# Patient Record
Sex: Female | Born: 1960 | Race: Black or African American | Hispanic: No
Health system: Midwestern US, Community
[De-identification: ages and names within clinical notes are randomized; demographics above are authoritative.]

## PROBLEM LIST (undated history)

## (undated) DIAGNOSIS — M1990 Unspecified osteoarthritis, unspecified site: Secondary | ICD-10-CM

## (undated) DIAGNOSIS — I10 Essential (primary) hypertension: Secondary | ICD-10-CM

## (undated) DIAGNOSIS — G5600 Carpal tunnel syndrome, unspecified upper limb: Secondary | ICD-10-CM

## (undated) DIAGNOSIS — H269 Unspecified cataract: Secondary | ICD-10-CM

## (undated) DIAGNOSIS — G4730 Sleep apnea, unspecified: Secondary | ICD-10-CM

## (undated) DIAGNOSIS — E349 Endocrine disorder, unspecified: Secondary | ICD-10-CM

## (undated) DIAGNOSIS — T7840XA Allergy, unspecified, initial encounter: Secondary | ICD-10-CM

## (undated) DIAGNOSIS — E079 Disorder of thyroid, unspecified: Secondary | ICD-10-CM

## (undated) HISTORY — PX: CARPAL TUNNEL RELEASE: SHX101

## (undated) HISTORY — DX: Disorder of thyroid, unspecified: E07.9

## (undated) HISTORY — DX: Unspecified osteoarthritis, unspecified site: M19.90

## (undated) HISTORY — PX: APPENDECTOMY: SHX54

## (undated) HISTORY — DX: Carpal tunnel syndrome, unspecified upper limb: G56.00

## (undated) HISTORY — PX: ABDOMINAL HYSTERECTOMY: SHX81

## (undated) HISTORY — DX: Unspecified cataract: H26.9

## (undated) HISTORY — PX: HEEL SPUR EXCISION: SHX1733

## (undated) HISTORY — DX: Endocrine disorder, unspecified: E34.9

## (undated) HISTORY — DX: Essential (primary) hypertension: I10

## (undated) HISTORY — DX: Sleep apnea, unspecified: G47.30

## (undated) HISTORY — DX: Allergy, unspecified, initial encounter: T78.40XA

---

## 2015-06-08 NOTE — Discharge Summary (Signed)
 Inpatient Patient Summary               [facility] - [facility]  [address]  [address]  [place], [place] [address]  [phone]  Patient Discharge Instructions  Name: Rebecca Frank, Rebecca Frank  Current Date: 06/08/2015 12:24:38  DOB: 1960-07-18 MRN: [id number] FIN: [phone]  Patient Address: [address] [place] [facility] [address]  Patient Phone: [phone]  Primary Care Provider:  Name: FRIZELLE-MD,  KATHERINE SEA  Phone: [phone]   Immunizations Provided:    Discharge Diagnosis: 1:Heel spur; 2:Plantar fasciitis  Discharged To: ANTICIPATED%>  Home Treatments: ANTICIPATED%>  Devices/Equipment: REHAB%>  Post Hospital Services: HOSPITAL SERVICES%>  Professional Skilled Services: SKILLED SERVICES%>  Therapist, sports and Community Resources: SERV AND COMM RES, ANTICIPATED%>  Mode of Discharge Transportation: TRANSPORTATION%>  Discharge Orders         Diet Instruction Regular home diet  Discharge Patient 06/08/15 12:19:00 EST, Discharge Home/Self Care, when patient meets PACU criteria  Discharge Special Instructions If you experience swelling or a significantly increased amount of pain or bleeding, please call the office at [phone].  Discharge Special Instructions Follow up in 1 week. You will be called to set up an appointment.  Discharge Special Instructions Keep your dressing dry and intact. You may shower using 13 gallon kitchen bag to protect and keep bandage dry. If it gets wet, remove ACE wrap and dry in dryer. Use hair blow dryer to dry the other bandages.      Comment:   Medications   During the course of your visit, your medication list was updated with the most current information. The details of those changes are reflected below:         Medications that have not changed  Other Medications  atorvastatin (atorvastatin 40 mg oral tablet) 1 Tabs Oral (given by mouth) every day.  Last Dose:____________________  hydrochlorothiazide  (hydrochlorothiazide 25 mg oral tablet) 1 Tabs Oral (given by mouth) every day.  Last Dose:____________________  levothyroxine 0.088 Milligram Oral (given by mouth) every day.  Last Dose:____________________      [facility] would like to thank you for allowing us  to assist you with your healthcare needs. The following includes patient education materials and information regarding your injury/illness.  Sarnowski, Kasen Z has been given the following list of follow-up instructions, prescriptions, and patient education materials:  Follow-up Instructions           It is important to always keep an active list of medications available so that you can share with other providers and manage your medications appropriately. As an additional courtesy, we are also providing you with your final active medications list that you can keep with you.          atorvastatin (atorvastatin 40 mg oral tablet) 1 Tabs Oral (given by mouth) every day.  hydrochlorothiazide (hydrochlorothiazide 25 mg oral tablet) 1 Tabs Oral (given by mouth) every day.  levothyroxine 0.088 Milligram Oral (given by mouth) every day.      Take only the medications listed above. Contact your doctor prior to taking any medications not on this list.  Discharge instructions, if any, will display below  Instructions for Diet: INSTRUCTIONS FOR DIET%>   Instructions for Supplements: SUPPLEMENT INSTRUCTIONS%>   Instructions for Activity: INSTRUCTIONS FOR ACTIVITY%>   Instructions for Wound Care: INSTRUCTIONS FOR WOUND CARE%>  Medication leaflets, if any, will display below     Patient education materials, if  any, will display below             Out Patient Post Operative Instructions  General Information:  - You may experience lightheadedness, forgetfulness, dizziness, sleepiness, headache, nausea, sore throat, or muscular pains following surgery  - For any emergencies call 911  -Your reflexes will be dimished after receiving anesthetic drugs         Do not operate  a vechicle or heavy machinery for 24 hours         Do not drink any alcoholic beverages or smoke for 24 hours         Avoid making any important decisions for 24 hours         Do not stay alone for the next 24 hours  Diet/Fluids  -Begin with clear liquids, then progress to your regular diet if no nausea   -Greasy and spicy foods are not advised  Activity  -You are advised to go directly home from the hospital and restrict your activites for the rest of the day  Medications:  -Follow your Discharge Medication sheet, as instructed  -If ordered, begin any newly prescribed medications. Discontinue use if: nausea, vomiting, itching or rash develops and call your doctor   -If you develop a fever (over 101*), chills, active bleeding, excessive swelling, or nausea or vomiting past the 24hr period call your doctor.   Dressing:   Keep clean and dry:   Change dressing:   Additional Instructions: SEE DR GUDAS INSTRUCTION SHEET  Follow up Care:  Call the office if you don't already have an appt scheduled.                            IS IT A STROKE? Act FAST and Check for these signs:   FACE Does the face look uneven?   ARM Does one arm drift down?   SPEECH Does their speech sound strange?   TIME Call 9-1-1 at any sign of stroke  Heart Attack Signs  Chest discomfort: Most heart attacks involve discomfort in the center of the chest and lasts more than a few minutes, or goes away and comes back. It can feel like uncomfortable pressure, squeezing, fullness or pain.  Discomfort in upper body: Symptoms can include pain or discomfort in one or both arms, back, neck, jaw or stomach.  Shortness of breath: With or without discomfort.  Other signs: Breaking out in a cold sweat, nausea, or lightheaded.  Remember, MINUTES DO MATTER. If you experience any of these heart attack warning signs, call 9-1-1 to get immediate medical attention!       Yes - Patient/Family/Caregiver demonstrates understanding of instructions  given  ______________________________ ___________ ___________________ ___________  Patient/Family/ Caregiver Signature Date/Time Provider Signature Date/Time

## 2015-06-08 NOTE — Discharge Summary (Signed)
Inpatient Clinical Summary             [facility]  Post-Acute Care Transfer Instructions  PERSON INFORMATION   Name: Rebecca Frank, Rebecca Frank   MRN: [id number]    FIN#: [id number]   PHYSICIANS  Admitting Physician: Fortunato Curling  Attending Physician: Fortunato Curling   PCP: Jonne Ply SEA  Discharge Diagnosis: 1:Heel spur; 2:Plantar fasciitis  Comment:       PATIENT EDUCATION INFORMATION  Instructions:             OUT PATIENT POST OP [place] (CUSTOM)  Medication Leaflets:               Follow-up:                               MEDICATION LIST  Medication Reconciliation at Discharge:         Medications that have not changed  Other Medications  atorvastatin (atorvastatin 40 mg oral tablet) 1 Tabs Oral (given by mouth) every day.  Last Dose:____________________  hydrochlorothiazide (hydrochlorothiazide 25 mg oral tablet) 1 Tabs Oral (given by mouth) every day.  Last Dose:____________________  levothyroxine 0.088 Milligram Oral (given by mouth) every day.  Last Dose:____________________         Patient's Final Home Medication List Upon Discharge:          atorvastatin (atorvastatin 40 mg oral tablet) 1 Tabs Oral (given by mouth) every day.  hydrochlorothiazide (hydrochlorothiazide 25 mg oral tablet) 1 Tabs Oral (given by mouth) every day.  levothyroxine 0.088 Milligram Oral (given by mouth) every day.         Comment:       ORDERS         Order Name Order Details   Diet Instruction Regular home diet   Discharge Patient 06/08/15 12:19:00 EST, Discharge Home/Self Care, when patient meets PACU criteria   Discharge Special Instructions Keep your dressing dry and intact. You may shower using 13 gallon kitchen bag to protect and keep bandage dry. If it gets wet, remove ACE wrap and dry in dryer. Use hair blow dryer to dry the other bandages.   Discharge Special Instructions If you experience swelling or a significantly increased amount of pain or bleeding, please call the office at  [phone].   Discharge Special Instructions Follow up in 1 week. You will be called to set up an appointment.

## 2015-06-08 NOTE — Procedures (Signed)
IntraOp Record - [facility]             IntraOp Record - [facility] Summary                                                                   Primary Physician:        Fortunato Curling    Case Number:              [id number]    Finalized Date/Time:      06/08/15 12:07:04    Pt. Name:                 Rebecca Frank, Rebecca Frank    D.O.B./Sex:               24-Sep-1960    Female    Med Rec #:                [id number]    Physician:                Fortunato Curling    Financial #:              [id number]    Pt. Type:                 S    Room/Bed:                 /    Admit/Disch:              06/08/15 09:49:00 -    Institution:       [facility] - Case Times                                                                                                         Entry 1                                                                                                          Patient      In Room Time             06/08/15 11:23:00               Out Room Time                   06/08/15 12:04:00    Anesthesia     Procedure  Start Time               06/08/15 11:40:00               Stop Time                       06/08/15 12:01:00    Last Modified By:         Delton See, RN, PATRICIA A                              06/08/15 12:06:47      [facility] - Case Times Audit                                                                          06/08/15 12:06:47         Owner: [email or web address]                             Modifier: [email or web address]                                                      <+> 1         Out Room Time        <+> 1         Stop Time     06/08/15 11:40:54         Owner: [email or web address]                             Modifier: [email or web address]                                                      <+> 1         Start Time        [facility] - Safety Checklist - Sign In                                                                                         Entry 1  History/Physical on       Yes                             Procedure Consent               Yes    Chart                                                     on Chart     Site Marked (if           Yes    applicable)     Last Modified By:         Delton See, RN, PATRICIA A                              06/08/15 11:08:52      [facility] - Case Attendance                                                                                                    Entry 1                         Entry 2                         Entry 3                                          Case Attendee             KIRSHTEIN-MD,  Cristy Hilts, RN, PATRICIA A    Role Performed            Anesthesiologist                Surgeon Primary                 Circulator    Time In                   06/08/15 11:23:00               06/08/15 11:23:00               06/08/15 11:23:00    Time Out     Procedure                 Foot Plantar Fasciotomy         Foot Plantar Fasciotomy         Foot Plantar Fasciotomy  Open(Foot, Right), Foot         Open(Foot, Right), Foot         Open(Foot, Right), Foot                              Bone Ostectomy                  Bone Ostectomy                  Bone Ostectomy                              SCIP(Heel, Right)               SCIP(Heel, Right)               SCIP(Heel, Right)    Last Modified By:         Delton See RN, PATRICIA Trilby Leaver, RN, PATRICIA Trilby Leaver, RN, PATRICIA A                              06/08/15 11:40:47               06/08/15 11:40:47               06/08/15 11:40:47                                Entry 4                         Entry 5                                                                          Case Attendee             Almon Register, RN, Lillia Pauls    Role Performed            Surgical Scrub                  Circulator Relief    Time In                   06/08/15 11:23:00               06/08/15  11:37:00    Time Out     Procedure                 Foot Plantar Fasciotomy                              Open(Foot, Right), Foot  Bone Ostectomy                              SCIP(Heel, Right)    Last Modified By:         Delton See RN, PATRICIA Trilby Leaver, RN, PATRICIA A                              06/08/15 11:40:47               06/08/15 11:40:47      [facility] - Case Attendance Audit                                                                     06/08/15 11:40:47         Owner: [email or web address]                             Modifier: [email or web address]                                                          1     <+> Time In            1     <*> Procedure                              Foot Plantar Fasciotomy Open(Foot, Right), Foot Bone Ostectomy                                                             SCIP(Heel, Right)            2     <+> Time In            2     <*> Procedure                              Foot Plantar Fasciotomy Open(Foot, Right), Foot Bone Ostectomy                                                             SCIP(Heel, Right)            3     <+> Time In            3     <*> Procedure  Foot Plantar Fasciotomy Open(Foot, Right), Foot Bone Ostectomy                                                             SCIP(Heel, Right)            4     <+> Time In            4     <*> Procedure                              Foot Plantar Fasciotomy Open(Foot, Right), Foot Bone Ostectomy                                                             SCIP(Heel, Right)        <+> 5         Case Attendee        <+> 5         Role Performed        <+> 5         Time In     06/08/15 11:08:48         Owner: [email or web address]                             Modifier: [email or web address]                                                      <+> 1         Procedure        <+> 2         Procedure        <+> 3         Case Attendee        <+> 3         Role Performed        <+> 3         Procedure         <+> 4         Case Attendee        <+> 4         Role Performed        <+> 4         Procedure        [facility] - Skin Assessment  Entry 1                                                                                                          Skin Integrity            Intact    Last Modified By:         Delton See, RN, PATRICIA A                              06/08/15 11:08:58      [facility] - Patient Positioning                                                                                                Entry 1                                                                                                          Procedure                 Foot Plantar Fasciotomy         Body Position                   Supine                              Open(Foot, Right), Foot                              Bone Ostectomy                              SCIP(Heel, Right)    Left Arm Position         Extended on Padded Arm          Right Arm Position              Extended on Padded Arm  Board w/Security Strap                                          Board w/Security Strap    Left Leg Position         Extended Security Strap         Right Leg Position              Extended Security Strap    Feet Uncrossed            Yes                             Pressure Points                 Yes                                                              Checked     Positioning Device        Safety Strap, Pillow,           Positioned By                   [employer], RN, PATRICIA A,                              Arm Cradle Foam/Gel, C                                          GUDAS-DPM,  CHARLES J,                              Cradle Foam                                                     KIRSHTEIN-MD,  JONATHAN    Outcome Met (O.80)        Yes    Last Modified By:         Delton See, RN, PATRICIA A                              06/08/15  11:46:43      [facility] - Skin Prep  Entry 1                                                                                                          Hair Removal     Skin Prep      Prep Agents (Im.270)     Chlorhexidine Gluconate         Prep Area (Im.270)              Foot                              2% w/Alcohol     Prep Area Details        Right                           Prep By                         Jarvis Newcomer A    Outcome Met (O.100)       Yes    Last Modified By:         Delton See, RN, PATRICIA A                              06/08/15 11:09:22      [facility] - Counts Initial and Final                                                                                           Entry 1                                                                                                          Initial Counts      Initial Counts           NELSON, RN, PATRICIA A,         Items included in               Sponges, Sharps     Performed By             Ida Rogue  the Initial Count     Final Counts      Final Counts             NELSON, RN, PATRICIA A,         Final Count Status              Correct     Performed By             Jarvis Newcomer A     Items Included in        Sponges, Sharps     Final Count     Outcome Met (O.20)        Yes    Last Modified By:         Delton See, RN, PATRICIA A                              06/08/15 11:41:22      [facility] - General Case Data                                                                                                  Entry 1                                                                                                          Case Information      ASA Class                2                               Case Level                      Level 3     OR                       JI 03                           Specialty                       Orthopedic (SN)     Wound Class               1-Clean    Preop Diagnosis           HEEL SPUR, PLANTAR  FASCIITIS    Last Modified By:         Delton See, RN, PATRICIA A                              06/08/15 11:40:18      [facility] - Fire Risk Assessment                                                                                               Entry 1                                                                                                          Fire Risk                 Alcohol Based Prep              Fire Risk Score                 2    Assessment: If            Solution, Ignition    checked, checkmark        Source In Use    = 1 point     Last Modified By:         Delton See, RN, PATRICIA A                              06/08/15 11:42:22      [facility] - Safety Checklist - Time Out                                                                                        Entry 1  Surgical/Procedure        Yes                             Time Out Complete               06/08/15 11:32:00    Team confirms     correct patient,     correct site and     correct procedure     Last Modified By:         Delton See, RN, PATRICIA A                              06/08/15 11:37:08    General Comments:            2nd time out at  1140      [facility] - Cautery                                                                                                            Entry 1                                                                                                          ESU Type                  GENERATOR                       Identification                  [id number]                              COVIDIEN/[employer]LEY[employer]              Number     Coag Setting (watts)      25                              Cut Setting (watts)             25    Grounding Pad             Yes                             Grounding Pad  NELSON, RN, PATRICIA A    Needed?                                                    Applied By     [employer] Lot # [id number]             Outcome Met (O.10)              Yes    Last Modified By:         Delton See, RN, PATRICIA A                              06/08/15 11:13:59      [facility] - Patient Care Devices                                                                                               Entry 1                         Entry 2                                                                          Equipment Type            MACHINE SEQUENTIAL              Gustavus Bryant                              COMPRESSION    SCD Sleeve Site           Leg Left    Equipment/Tag Number      [id number]                          [id number]    Initiated Pre             Yes    Induction     Last Modified By:         Delton See RN, PATRICIA Trilby Leaver, RN, PATRICIA A                              06/08/15 11:43:12               06/08/15 11:43:12      [facility] - Tourniquet  Entry 1                                                                                                          Tourniquet Type           TOURNIQUET CUFF 18IN W/         Serial Number                   [id number]                              PLC CONN STRL DISP    Setting                   250 mmHg                        Placement                       Ankle Right    Padding (Im.120)          Yes    Tourniquet Times      Inflated                 06/08/15 11:39:00               Deflated                        06/08/15 11:59:00    Applied By                Fortunato Curling           Outcome Met (O.60)              Yes    Last Modified By:         Delton See, RN, PATRICIA A                              06/08/15 12:04:01      [facility] - Tourniquet Audit                                                                          06/08/15 12:04:01         Owner: [email or web address]                              Modifier: [email or web address]  1     <*> Tourniquet Type                        TOURNIQUET CUFF 18IN W/ PLC CONN STRL DISP            1     <+> Deflated     06/08/15 11:41:02         Owner: [email or web address]                             Modifier: [email or web address]                                                          1     <*> Tourniquet Type                        TOURNIQUET CUFF 18IN W/ PLC CONN STRL DISP            1     <+> Inflated        [facility] - Medications                                                                                                        Entry 1                         Entry 2                                                                          Time Administered         06/08/15 11:33:00               06/08/15 11:33:00    Medication                BUPIVACAINE HCL 0.5%            LIDOCAINE HCL 2%                              INJECTION 0.5%             INJECTION 2%    Route of Admin            Local Injection                 Local Injection    Dose/Volume               5ml  5ml    (include amount and     unit of measure)     Site                      Foot                            Foot    Site Detail               Right                           Right    Administered By           Tobie Lords,  CHARLES J    Outcome Met (O.130)       Yes                             Yes    Last Modified By:         Delton See RN, PATRICIA Trilby Leaver, RN, PATRICIA A                              06/08/15 11:45:12               06/08/15 11:45:12      [facility] - Dressing/Packing                                                                                                   Entry 1                                                                                                          Site                      Foot                            Site Details                    Right    Dressing Item     Details       Dressing Item            Medicated Gauze, 4x4's,     (Im.290)  Elastic wrap,                              Kerlix/Kling Wrap    Last Modified By:         Delton See, RN, PATRICIA A                              06/08/15 11:42:17      [facility] - Procedures                                                                                                         Entry 1                         Entry 2                                                                          Procedure     Description      Procedure                Foot Plantar Fasciotomy         Foot Bone Ostectomy SCIP                              Open     Modifiers                Foot, Right                     Heel, Right     Surgical Procedure       FOOT PLANTAR FASCIOTOMY         HEEL SPUR REMOVAL RIGHT     Text                     RIGHT    Primary Procedure         Yes                             No    Primary Surgeon           Francoise Ceo    Start                     06/08/15 11:40:00               06/08/15 11:40:00    Stop  06/08/15 12:01:00               06/08/15 12:01:00    Anesthesia Type           General                         General    Surgical Service          Orthopedic (SN)                 Orthopedic (SN)    Wound Class               1-Clean                         1-Clean    Last Modified By:         Delton See RN, PATRICIA Trilby Leaver, RN, PATRICIA A                              06/08/15 12:07:00               06/08/15 12:07:00      [facility] - Procedures Audit                                                                          06/08/15 12:07:00         Owner: [email or web address]                             Modifier: [email or web address]                                                      <+> 1         Start        <+> 1         Stop        <+> 2         Start        <+> 2         Stop        [facility] - Safety Checklist - Sign Out                                                                                         Entry 1  Patient Safety            Yes    Communication Guide     Used Throughout Case     Last Modified By:         Delton See, RN, PATRICIA A                              06/08/15 11:08:55      [facility] - Transfer                                                                                                           Entry 1                                                                                                          Transferred By            Delton See, RN, PATRICIA A,         Via                             Stretcher                              KIRSHTEIN-MD,  JONATHAN    Post-op Destination       PACU    Skin Assessment      Condition                Intact    Last Modified ByDelton See, RN, PATRICIA A                              06/08/15 11:11:21

## 2015-10-10 ENCOUNTER — Other Ambulatory Visit: Payer: Self-pay | Admitting: Internal Medicine

## 2015-10-10 DIAGNOSIS — Z1231 Encounter for screening mammogram for malignant neoplasm of breast: Secondary | ICD-10-CM

## 2015-10-28 ENCOUNTER — Ambulatory Visit
Admission: RE | Admit: 2015-10-28 | Discharge: 2015-10-28 | Disposition: A | Discharge status: Home / Self Care | Payer: PRIVATE HEALTH INSURANCE | Source: Ambulatory Visit | Attending: Internal Medicine | Admitting: Internal Medicine

## 2015-10-28 DIAGNOSIS — Z1231 Encounter for screening mammogram for malignant neoplasm of breast: Secondary | ICD-10-CM | POA: Insufficient documentation

## 2016-11-16 ENCOUNTER — Encounter: Payer: Self-pay | Admitting: Certified Nurse Midwife

## 2017-01-03 ENCOUNTER — Other Ambulatory Visit: Payer: Self-pay | Admitting: Internal Medicine

## 2017-01-03 DIAGNOSIS — Z1231 Encounter for screening mammogram for malignant neoplasm of breast: Secondary | ICD-10-CM

## 2017-01-04 ENCOUNTER — Ambulatory Visit
Admission: RE | Admit: 2017-01-04 | Discharge: 2017-01-04 | Disposition: A | Discharge status: Home / Self Care | Payer: PRIVATE HEALTH INSURANCE | Source: Ambulatory Visit | Attending: Internal Medicine | Admitting: Internal Medicine

## 2017-01-04 DIAGNOSIS — Z1231 Encounter for screening mammogram for malignant neoplasm of breast: Secondary | ICD-10-CM | POA: Insufficient documentation

## 2017-07-22 ENCOUNTER — Ambulatory Visit (INDEPENDENT_AMBULATORY_CARE_PROVIDER_SITE_OTHER): Payer: PRIVATE HEALTH INSURANCE | Admitting: Urology

## 2017-07-22 ENCOUNTER — Encounter: Payer: Self-pay | Admitting: Urology

## 2017-07-22 VITALS — BP 152/82 | HR 89 | Ht 68.0 in | Wt 250.9 lb

## 2017-07-22 DIAGNOSIS — R3129 Other microscopic hematuria: Secondary | ICD-10-CM

## 2017-07-22 LAB — URINALYSIS, COMPLETE
Bilirubin, UA: NEGATIVE
GLUCOSE, UA: NEGATIVE
Ketones, UA: NEGATIVE
Leukocytes, UA: NEGATIVE
NITRITE UA: NEGATIVE
PH UA: 7 (ref 5.0–7.5)
Protein, UA: NEGATIVE
Specific Gravity, UA: 1.01 (ref 1.005–1.030)
UUROB: 0.2 mg/dL (ref 0.2–1.0)

## 2017-07-22 LAB — MICROSCOPIC EXAMINATION: EPITHELIAL CELLS (NON RENAL): NONE SEEN /HPF (ref 0–10)

## 2017-07-22 NOTE — Progress Notes (Signed)
07/22/2017 2:19 PM   Laura Walls 01-23-1961 [id number]  Referring provider: Gilles Chiquito, MD [address] [place], [place] [address]  Chief Complaint  Patient presents with  . Hematuria    HPI: Consulted to assess the patient's microscopic hematuria recently found on routine urine and blood work.  She has never smoked.  She does not take daily aspirin or blood thinners.  She voids every 2 or 3 hours and rarely has incontinence.  Sometimes she gets up once a night  She denies history of kidney stones previous GU surgery and urinary tract infections.  Bowel movements normal.  She has no neurologic issues.  Modifying factors: There are no other modifying factors  Associated signs and symptoms: There are no other associated signs and symptoms Aggravating and relieving factors: There are no other aggravating or relieving factors Severity: Moderate Duration: Persistent   PMH: Past Medical History:  Diagnosis Date  . Hormone disorder   . Hypertension   . Thyroid disease     Surgical History: Past Surgical History:  Procedure Laterality Date  . ABDOMINAL HYSTERECTOMY    . HEEL SPUR EXCISION      Home Medications:  Allergies as of 07/22/2017   Not on File     Medication List        Accurate as of 07/22/17  2:19 PM. Always use your most recent med list.          atorvastatin 10 MG tablet Commonly known as:  LIPITOR Take by mouth.   hydrochlorothiazide 25 MG tablet Commonly known as:  HYDRODIURIL Take by mouth.   levothyroxine 125 MCG tablet Commonly known as:  SYNTHROID, LEVOTHROID Take by mouth.   PREPOPIK 10-3.5-12 MG-GM-GM Pack Generic drug:  Sod Picosulfate-Mag Ox-Cit Acd Take by mouth.       Allergies: Not on File  Family History: Family History  Problem Relation Age of Onset  . Breast cancer Neg Hx   . Bladder Cancer Neg Hx   . Kidney cancer Neg Hx     Social History:  reports that she has never smoked. She has never used  smokeless tobacco. She reports that she drank alcohol. She reports that she does not use drugs.  ROS: UROLOGY Frequent Urination?: No Hard to postpone urination?: No Burning/pain with urination?: No Get up at night to urinate?: No Leakage of urine?: No Urine stream starts and stops?: No Trouble starting stream?: No Do you have to strain to urinate?: No Blood in urine?: Yes Urinary tract infection?: No Sexually transmitted disease?: No Injury to kidneys or bladder?: No Painful intercourse?: No Weak stream?: No Currently pregnant?: No Vaginal bleeding?: No Last menstrual period?: n  Gastrointestinal Nausea?: No Vomiting?: No Indigestion/heartburn?: No Diarrhea?: No Constipation?: No  Constitutional Fever: No Night sweats?: No Weight loss?: No Fatigue?: No  Skin Skin rash/lesions?: No Itching?: No  Eyes Blurred vision?: No Double vision?: No  Ears/Nose/Throat Sore throat?: No Sinus problems?: No  Hematologic/Lymphatic Swollen glands?: No Easy bruising?: No  Cardiovascular Leg swelling?: No Chest pain?: No  Respiratory Cough?: No Shortness of breath?: No  Endocrine Excessive thirst?: No  Musculoskeletal Back pain?: No Joint pain?: No  Neurological Headaches?: No Dizziness?: No  Psychologic Depression?: No Anxiety?: No  Physical Exam: BP (!) 152/82 (BP Location: Right Arm, Patient Position: Sitting, Cuff Size: Normal)   Pulse 89   Ht 5\' 8"  (1.727 m)   Wt 113.8 kg (250 lb 14.4 oz)   BMI 38.15 kg/m   Constitutional:  Alert  and oriented, No acute distress. HEENT: [place] AT, moist mucus membranes.  Trachea midline, no masses. Cardiovascular: No clubbing, cyanosis, or edema. Respiratory: Normal respiratory effort, no increased work of breathing. GI: Abdomen is soft, nontender, nondistended, no abdominal masses GU: No CVA tenderness.  Skin: No rashes, bruises or suspicious lesions. Lymph: No cervical or inguinal adenopathy. Neurologic: Grossly  intact, no focal deficits, moving all 4 extremities. Psychiatric: Normal mood and affect.  Laboratory Data: No results found for: WBC, HGB, HCT, MCV, PLT  No results found for: CREATININE  No results found for: PSA  No results found for: TESTOSTERONE  No results found for: HGBA1C  Urinalysis No results found for: COLORURINE, APPEARANCEUR, LABSPEC, PHURINE, GLUCOSEU, HGBUR, BILIRUBINUR, KETONESUR, PROTEINUR, UROBILINOGEN, NITRITE, LEUKOCYTESUR  Pertinent Imaging: none  Assessment & Plan: Workup for microscopic hematuria described.  CT scan ordered.  Patient will return for cystoscopy  1. Microscopic hematuria  - Urinalysis, Complete   No follow-ups on file.  Martina SinnerMACDIARMID,Gar Glance A, MD  [facility] [address], [address] [place], [place] [address] [phone]

## 2017-08-28 ENCOUNTER — Ambulatory Visit
Admission: RE | Admit: 2017-08-28 | Discharge: 2017-08-28 | Disposition: A | Discharge status: Home / Self Care | Payer: PRIVATE HEALTH INSURANCE | Source: Ambulatory Visit | Attending: Urology | Admitting: Urology

## 2017-08-28 DIAGNOSIS — R3129 Other microscopic hematuria: Secondary | ICD-10-CM | POA: Diagnosis present

## 2017-08-28 MED ORDER — IOPAMIDOL (ISOVUE-300) INJECTION 61%
125.0000 mL | Freq: Once | INTRAVENOUS | Status: AC | PRN
  Administered 2017-08-28: 125 mL via INTRAVENOUS

## 2017-09-02 ENCOUNTER — Ambulatory Visit (INDEPENDENT_AMBULATORY_CARE_PROVIDER_SITE_OTHER): Payer: PRIVATE HEALTH INSURANCE | Admitting: Urology

## 2017-09-02 ENCOUNTER — Encounter: Payer: Self-pay | Admitting: Urology

## 2017-09-02 VITALS — BP 153/89 | HR 99 | Ht 68.0 in | Wt 246.7 lb

## 2017-09-02 DIAGNOSIS — R3129 Other microscopic hematuria: Secondary | ICD-10-CM

## 2017-09-02 LAB — MICROSCOPIC EXAMINATION

## 2017-09-02 LAB — URINALYSIS, COMPLETE
Bilirubin, UA: NEGATIVE
GLUCOSE, UA: NEGATIVE
Ketones, UA: NEGATIVE
Nitrite, UA: NEGATIVE
Protein, UA: NEGATIVE
SPEC GRAV UA: 1.02 (ref 1.005–1.030)
Urobilinogen, Ur: 1 mg/dL (ref 0.2–1.0)
pH, UA: 7 (ref 5.0–7.5)

## 2017-09-02 MED ORDER — CIPROFLOXACIN HCL 500 MG PO TABS
500.0000 mg | ORAL_TABLET | Freq: Once | ORAL | Status: AC
  Administered 2017-09-02: 500 mg via ORAL

## 2017-09-02 MED ORDER — LIDOCAINE HCL URETHRAL/MUCOSAL 2 % EX GEL
1.0000 | Freq: Once | CUTANEOUS | Status: AC
Start: 2017-09-02 — End: 2017-09-02
  Administered 2017-09-02: 1 via URETHRAL

## 2017-09-02 NOTE — Progress Notes (Signed)
   09/02/2017 3:07 PM   Laura Walls Sep 22, 1960 [id number]  Referring provider: Gilles Chiquito, MD [address] [place], [place] [address]  Chief Complaint  Patient presents with  . Cysto    HPI: Consulted to assess the patient's microscopic hematuria recently found on routine urine and blood work.  She has never smoked.  She does not take daily aspirin or blood thinners.  She voids every 2 or 3 hours and rarely has incontinence.  Sometimes she gets up once a night  Today Frequency stable.  CT scan normal Cystoscopy: After verbal and written consent the patient underwent flexible cystoscopy utilizing sterile technique.  Bladder mucosa and trigone were normal.  There is no carcinoma.  Ureteral efflux was normal.  There is no cystitis.  There was no foreign body.  Procedure was very well tolerated   PMH: Past Medical History:  Diagnosis Date  . Hormone disorder   . Hypertension   . Thyroid disease     Surgical History: Past Surgical History:  Procedure Laterality Date  . ABDOMINAL HYSTERECTOMY    . HEEL SPUR EXCISION      Home Medications:  Allergies as of 09/02/2017   No Known Allergies     Medication List        Accurate as of 09/02/17  3:07 PM. Always use your most recent med list.          atorvastatin 10 MG tablet Commonly known as:  LIPITOR Take by mouth.   hydrochlorothiazide 25 MG tablet Commonly known as:  HYDRODIURIL Take by mouth.   levothyroxine 125 MCG tablet Commonly known as:  SYNTHROID, LEVOTHROID Take by mouth.   PREPOPIK 10-3.5-12 MG-GM-GM Pack Generic drug:  Sod Picosulfate-Mag Ox-Cit Acd Take by mouth.       Allergies: No Known Allergies  Family History: Family History  Problem Relation Age of Onset  . Breast cancer Neg Hx   . Bladder Cancer Neg Hx   . Kidney cancer Neg Hx     Social History:  reports that she has never smoked. She has never used smokeless tobacco. She reports that she drank alcohol. She reports  that she does not use drugs.  ROS:                                        Physical Exam: BP (!) 153/89 (BP Location: Right Arm, Patient Position: Sitting, Cuff Size: Large)   Pulse 99   Ht  (1.727 m)   Wt 246 lb 11.2 oz (111.9 kg)   BMI 37.51 kg/m   Constitutional:  Alert and oriented, No acute distress.   Laboratory Data:  No results found for: TESTOSTERONE  No results found for: HGBA1C  Urinalysis    Component Value Date/Time   APPEARANCEUR Clear 07/22/2017 1407   GLUCOSEU Negative 07/22/2017 1407   BILIRUBINUR Negative 07/22/2017 1407   PROTEINUR Negative 07/22/2017 1407   NITRITE Negative 07/22/2017 1407   LEUKOCYTESUR Negative 07/22/2017 1407    Pertinent Imaging:   Assessment & Plan: The patient has benign microscopic hematuria and will be seen on a as needed basis  1. Microscopic hematuria  - Urinalysis, Complete - ciprofloxacin (CIPRO) tablet 500 mg - lidocaine (XYLOCAINE) 2 % jelly 1 application   No follow-ups on file.  Martina Sinner, MD  [facility] [address], [address] [place], [place] [address] [phone]

## 2018-11-12 ENCOUNTER — Other Ambulatory Visit: Payer: Self-pay

## 2018-11-12 MED ORDER — ATORVASTATIN CALCIUM 10 MG PO TABS: 10.0000 mg | ORAL_TABLET | Freq: Every day | ORAL | 3 refills | Status: DC

## 2018-11-17 ENCOUNTER — Other Ambulatory Visit: Payer: Self-pay

## 2018-11-17 DIAGNOSIS — I10 Essential (primary) hypertension: Secondary | ICD-10-CM

## 2018-11-17 DIAGNOSIS — E039 Hypothyroidism, unspecified: Secondary | ICD-10-CM

## 2018-11-17 MED ORDER — HYDROCHLOROTHIAZIDE 25 MG PO TABS: 25.0000 mg | ORAL_TABLET | Freq: Every day | ORAL | 0 refills | Status: DC

## 2018-11-17 MED ORDER — LEVOTHYROXINE SODIUM 125 MCG PO TABS: 125.0000 ug | ORAL_TABLET | Freq: Every day | ORAL | 0 refills | Status: DC

## 2018-11-17 NOTE — Telephone Encounter (Signed)
Patient's last visit 06/2017. Last TSH check 08/2017 and was ok. (Was high on 05/2017). She needs a f/u TSH and a f/u visit ([facility] med request was for HTN).

## 2018-11-17 NOTE — Telephone Encounter (Signed)
Called made pt appt for labs and physical

## 2019-01-06 ENCOUNTER — Encounter: Payer: Self-pay | Admitting: Internal Medicine

## 2019-01-07 ENCOUNTER — Encounter: Payer: Self-pay | Admitting: Internal Medicine

## 2019-01-13 ENCOUNTER — Ambulatory Visit: Payer: 59

## 2019-01-13 ENCOUNTER — Other Ambulatory Visit: Payer: Self-pay

## 2019-01-13 DIAGNOSIS — Z01818 Encounter for other preprocedural examination: Secondary | ICD-10-CM

## 2019-01-13 LAB — POCT URINALYSIS DIPSTICK
Bilirubin, UA: NEGATIVE
Glucose, UA: NEGATIVE
Ketones, UA: NEGATIVE
Leukocytes, UA: NEGATIVE
Nitrite, UA: NEGATIVE
Protein, UA: NEGATIVE
Spec Grav, UA: 1.02 (ref 1.010–1.025)
Urobilinogen, UA: 0.2 E.U./dL
pH, UA: 6 (ref 5.0–8.0)

## 2019-01-14 LAB — CMP12+LP+TP+TSH+6AC+CBC/D/PLT
ALT: 10 IU/L (ref 0–32)
AST: 19 IU/L (ref 0–40)
Albumin/Globulin Ratio: 1.2 (ref 1.2–2.2)
Albumin: 4.1 g/dL (ref 3.8–4.9)
Alkaline Phosphatase: 97 IU/L (ref 39–117)
BUN/Creatinine Ratio: 15 (ref 12–28)
BUN: 13 mg/dL (ref 8–27)
Basophils Absolute: 0.1 10*3/uL (ref 0.0–0.2)
Basos: 1 %
Bilirubin Total: 0.6 mg/dL (ref 0.0–1.2)
Calcium: 9.6 mg/dL (ref 8.7–10.3)
Chloride: 98 mmol/L (ref 96–106)
Chol/HDL Ratio: 4.2 ratio (ref 0.0–4.4)
Cholesterol, Total: 150 mg/dL (ref 100–199)
Creatinine, Ser: 0.88 mg/dL (ref 0.57–1.00)
EOS (ABSOLUTE): 0.2 10*3/uL (ref 0.0–0.4)
Eos: 4 %
Estimated CHD Risk: 0.9 times avg. (ref 0.0–1.0)
Free Thyroxine Index: 3.1 (ref 1.2–4.9)
GFR calc Af Amer: 83 mL/min/{1.73_m2} (ref >59)
GFR calc non Af Amer: 72 mL/min/{1.73_m2} (ref >59)
GGT: 26 IU/L (ref 0–60)
Globulin, Total: 3.3 g/dL (ref 1.5–4.5)
Glucose: 98 mg/dL (ref 65–99)
HDL: 36 mg/dL — ABNORMAL LOW (ref >39)
Hematocrit: 38.9 % (ref 34.0–46.6)
Hemoglobin: 12.5 g/dL (ref 11.1–15.9)
Immature Grans (Abs): 0 10*3/uL (ref 0.0–0.1)
Immature Granulocytes: 1 %
Iron: 93 ug/dL (ref 27–159)
LDH: 142 IU/L (ref 119–226)
LDL Chol Calc (NIH): 95 mg/dL (ref 0–99)
Lymphocytes Absolute: 3.3 10*3/uL — ABNORMAL HIGH (ref 0.7–3.1)
Lymphs: 54 %
MCH: 26.6 pg (ref 26.6–33.0)
MCHC: 32.1 g/dL (ref 31.5–35.7)
MCV: 83 fL (ref 79–97)
Monocytes Absolute: 0.3 10*3/uL (ref 0.1–0.9)
Monocytes: 6 %
Neutrophils Absolute: 2 10*3/uL (ref 1.4–7.0)
Neutrophils: 34 %
Phosphorus: 4 mg/dL (ref 3.0–4.3)
Platelets: 397 10*3/uL (ref 150–450)
Potassium: 4.2 mmol/L (ref 3.5–5.2)
RBC: 4.7 x10E6/uL (ref 3.77–5.28)
RDW: 13.4 % (ref 11.7–15.4)
Sodium: 141 mmol/L (ref 134–144)
T3 Uptake Ratio: 28 % (ref 24–39)
T4, Total: 11.2 ug/dL (ref 4.5–12.0)
TSH: 2.33 u[IU]/mL (ref 0.450–4.500)
Total Protein: 7.4 g/dL (ref 6.0–8.5)
Triglycerides: 105 mg/dL (ref 0–149)
Uric Acid: 6.3 mg/dL (ref 2.5–7.1)
VLDL Cholesterol Cal: 19 mg/dL (ref 5–40)
WBC: 5.9 10*3/uL (ref 3.4–10.8)

## 2019-01-22 ENCOUNTER — Other Ambulatory Visit: Payer: Self-pay

## 2019-01-22 ENCOUNTER — Ambulatory Visit: Payer: Self-pay | Admitting: Internal Medicine

## 2019-01-22 ENCOUNTER — Encounter: Payer: Self-pay | Admitting: Internal Medicine

## 2019-01-22 VITALS — BP 126/70 | HR 70 | Temp 98.2°F | Resp 12 | Ht 68.0 in | Wt 240.0 lb

## 2019-01-22 DIAGNOSIS — Z800 Family history of malignant neoplasm of digestive organs: Secondary | ICD-10-CM

## 2019-01-22 DIAGNOSIS — R600 Localized edema: Secondary | ICD-10-CM | POA: Insufficient documentation

## 2019-01-22 DIAGNOSIS — Z1231 Encounter for screening mammogram for malignant neoplasm of breast: Secondary | ICD-10-CM | POA: Insufficient documentation

## 2019-01-22 DIAGNOSIS — M25562 Pain in left knee: Secondary | ICD-10-CM | POA: Insufficient documentation

## 2019-01-22 DIAGNOSIS — E7849 Other hyperlipidemia: Secondary | ICD-10-CM

## 2019-01-22 DIAGNOSIS — Z23 Encounter for immunization: Secondary | ICD-10-CM

## 2019-01-22 DIAGNOSIS — I10 Essential (primary) hypertension: Secondary | ICD-10-CM | POA: Insufficient documentation

## 2019-01-22 DIAGNOSIS — Z6836 Body mass index (BMI) 36.0-36.9, adult: Secondary | ICD-10-CM

## 2019-01-22 DIAGNOSIS — E785 Hyperlipidemia, unspecified: Secondary | ICD-10-CM | POA: Insufficient documentation

## 2019-01-22 DIAGNOSIS — R319 Hematuria, unspecified: Secondary | ICD-10-CM | POA: Insufficient documentation

## 2019-01-22 DIAGNOSIS — E039 Hypothyroidism, unspecified: Secondary | ICD-10-CM

## 2019-01-22 NOTE — Progress Notes (Signed)
Laura Walls  - 58 y.o female who presents for annual physical evaluation  She noted increased left knee pain that started 3 weeks ago after was biking and her seat was very low. She felt the pain lateral side of knee and extending above and below the knee proper, not locking or giving out, not swell significantly, worse going down steps noted, not ice, did take and aleve and helped a lot. Still bothersome but better. Has a h/o left knee pain in the past noted as well.   No other specific complaints, denies any recent CP, palpitations, SOB, abdominal pains, change in bowel habits, dark/black stools, vision changes, recent fevers, or other Covid concerning sx'Laura Walls, did note her ankles and feet can swell some, increased at end of day often, with the left foot more than the right swollen in very recent past and thinks may be related to when injured the knee. Ankle and foot not painful. Denies any urinary sx'Laura Walls and had work-up with urology last year for hematuria with a cystoscope done and patient told was ok, and may be a person that just has a little blood in her urine.  Exercise - no regular exercise regimen, is trying to walk more  Diet - notes tries to watch and eat healthy, tried intermittent fasting and has had some success with this (and notes is hard)  Meds reviewed Current Outpatient Medications on File Prior to Visit  Medication Sig Dispense Refill  . atorvastatin (LIPITOR) 10 MG tablet Take 1 tablet (10 mg total) by mouth daily at 6 PM. 90 tablet 3  . hydrochlorothiazide (HYDRODIURIL) 25 MG tablet Take 1 tablet (25 mg total) by mouth daily. 90 tablet 0  . levothyroxine (SYNTHROID) 125 MCG tablet Take 1 tablet (125 mcg total) by mouth daily before breakfast. 90 tablet 0   No current facility-administered medications on file prior to visit.      No Known Allergies  Social History   Tobacco Use  Smoking Status Never Smoker  Smokeless Tobacco Never Used    FH - M died from colon CA - 61'Laura Walls   O -  NAD, masked, obese  BP 126/70 (BP Location: Left Arm, Patient Position: Sitting, Cuff Size: Large)   Pulse 70   Temp 98.2 F (36.8 C) (Oral)   Resp 12   Ht 5\' 8"  (1.727 m)   Wt 240 lb (108.9 kg)   SpO2 98%   BMI 36.49 kg/m   HEENT - sclera anicteric, + glasses, PERRL, EOMI, conj - non-inj'ed, No sinus tenderness, TM'Laura Walls and canals clear Neck - supple, no adenopathy, no TM, carotids 2+ and = without bruits bilat Car - RRR without m/g/r Pulm- CTA without wheeze or rales Abd - soft, obese, NT, ND, BS+, no obvious HSM, no masses Back - no CVA tenderness Skin- no new lesions of concern on exposed areas, denied otherwise Ext - trace LE edema right, 1+ LE edema on left in ankle and foot DP pulse intact on right, NT to palpate dorsum of foot, no calf tenderness, no increased erythema, warmth, no cords,   L  Knee - Good ROM, no marked limitations, mild discomfort in the last 10 degrees of flexion, could fully extend  No marked crepitus on ROM testing  No effusion  No bruising  NT with movement of the patella, no gross deformity of patella  NT suprapatella bursa region  No medial joint line pain, mild lateral joint line tenderness with palpation and noted discomfort often extends up lateral thigh  and distal as well and less tender to palpate these areas than over joint line  Med and lat collaterals intact on testing with no gap, no pain with testing  Lachman neg  And and post drawer neg  McMurrays neg, no catch Neuro - affect was not flat, appropriate with conversation  Grossly non-focal with good strength on testing, sensation intact to LT in distal extremities, Romberg neg, no pronator drift, good balance on one foot, good finger to nose, good RAMs  Labs reviewed - of note -  1+ blood in urine, was 2+ and 1+ on last checks a year ago, kidney fcn normal, HDL - 36, LDL - 95, TC - 150, TSH - 7.408,   ECG reviewed - no concerning changes from prior ECG  Colonoscopy screening discussed and  reviewed, had last year and no concerns, rec'ed f/u in 5 years due to + FH  Last mammogram 12/2017 and no concerns, f/u yearly rec'ed and due   Ass/Plan: 1. Left knee pain - likely an ITB component and less likely an acute meniscus tear noted, question if some chronic meniscal pathology based on her history prior. No evidence for any major ligament injury   RICE modalities short term rec'ed, relative rest short term Can use aleve bid with food prn short term as well  If not continuing to improve/resolve, should f/u and may need further imaging, PT pending her assessment on f/u Weight loss will be helpful with knee issues over time and she is currently working hard with this  2. Hypothyroid - TSH good on recent check  Cont the thyroid supplement   3. HTN - controlled on medicines  Continue medications to manage Discussed goals for good control of BP  Importance of healthy diet and regular aerobic exercise and weight control noted Continue to monitor  4.  Hyperlipidemia  statin to continue Importance of diet modifications and some regular aerobic exercise encouraged Above to help with weight control/weight loss also very important   5. Increased BMI/obesity  Having some success with intermittent fasting and can continue to help with goals of weight loss. Importance of diet modifications and regular aerobic exercise emphasized  6. Hematuria - had urology work-up and negative, still with blood in urine noted  Will recheck urine dip in office again on f/u in 2 weeks and if +, send for UA and C & Laura Walls. If no infectious concerns, do feel further work-up is needed and nephrology referral best to help at that point (as noted not feel blood in the urine is normal and would like to make sure no potential kidney source/concern). Discussed this with her today.  7. LE edema - likely a dependent edema concern, may be a little worse on left due to recent left knee injury and inflammation. No  evidence for DVT clinically.   Cont diuretic (taking for BP), may need to increase in near future pending status clinically Weight loss can be helpful  May need further work-up over time, especially if remains slightly worse on one side after the acute knee pain resolves and await f/u assessments  8. Breast CA screening - mammogram ordered  9. + FH colon CA - recent colonoscopy ok, f/u planned 5 years from last due to + FH  F/u in 2 weeks for repeat urine as noted above and f/u of knee/LE edema, f/u sooner prn

## 2019-01-22 NOTE — Patient Instructions (Signed)
Acute Knee Pain, Adult °Acute knee pain is sudden and may be caused by damage, swelling, or irritation of the muscles and tissues that support your knee. The injury may result from: °· A fall. °· An injury to your knee from twisting motions. °· A hit to the knee. °· Infection. °Acute knee pain may go away on its own with time and rest. If it does not, your health care provider may order tests to find the cause of the pain. These may include: °· Imaging tests, such as an X-ray, MRI, or ultrasound. °· Joint aspiration. In this test, fluid is removed from the knee. °· Arthroscopy. In this test, a lighted tube is inserted into the knee and an image is projected onto a TV screen. °· Biopsy. In this test, a sample of tissue is removed from the body and studied under a microscope. °Follow these instructions at home: °Pay attention to any changes in your symptoms. Take these actions to relieve your pain. °If you have a knee sleeve or brace: ° °· Wear the sleeve or brace as told by your health care provider. Remove it only as told by your health care provider. °· Loosen the sleeve or brace if your toes tingle, become numb, or turn cold and blue. °· Keep the sleeve or brace clean. °· If the sleeve or brace is not waterproof: °? Do not let it get wet. °? Cover it with a watertight covering when you take a bath or shower. °Activity °· Rest your knee. °· Do not do things that cause pain or make pain worse. °· Avoid high-impact activities or exercises, such as running, jumping rope, or doing jumping jacks. °· Work with a physical therapist to make a safe exercise program, as recommended by your health care provider. Do exercises as told by your physical therapist. °Managing pain, stiffness, and swelling ° °· If directed, put ice on the knee: °? Put ice in a plastic bag. °? Place a towel between your skin and the bag. °? Leave the ice on for 20 minutes, 2-3 times a day. °· If directed, use an elastic bandage to put pressure  (compression) on your injured knee. This may control swelling, give support, and help with discomfort. °General instructions °· Take over-the-counter and prescription medicines only as told by your health care provider. °· Raise (elevate) your knee above the level of your heart when you are sitting or lying down. °· Sleep with a pillow under your knee. °· Do not use any products that contain nicotine or tobacco, such as cigarettes, e-cigarettes, and chewing tobacco. These can delay healing. If you need help quitting, ask your health care provider. °· If you are overweight, work with your health care provider and a dietitian to set a weight-loss goal that is healthy and reasonable for you. Extra weight can put pressure on your knee. °· Keep all follow-up visits as told by your health care provider. This is important. °Contact a health care provider if: °· Your knee pain continues, changes, or gets worse. °· You have a fever along with knee pain. °· Your knee feels warm to the touch. °· Your knee buckles or locks up. °Get help right away if: °· Your knee swells, and the swelling becomes worse. °· You cannot move your knee. °· You have severe pain in your knee. °Summary °· Acute knee pain can be caused by a fall, an injury, an infection, or damage, swelling, or irritation of the tissues that support your knee. °·   Your health care provider may perform tests to find out the cause of the pain. °· Pay attention to any changes in your symptoms. Relieve your pain with rest, medicines, light activity, and use of ice. °· Get help if your pain continues or becomes worse, your knee swells, or you cannot move your knee. °This information is not intended to replace advice given to you by your health care provider. Make sure you discuss any questions you have with your health care provider. °Document Released: 02/04/2007 Document Revised: 09/19/2017 Document Reviewed: 09/19/2017 °Elsevier Patient Education © 2020 [employer] ° °

## 2019-02-12 ENCOUNTER — Other Ambulatory Visit: Payer: Self-pay

## 2019-02-12 DIAGNOSIS — I10 Essential (primary) hypertension: Secondary | ICD-10-CM

## 2019-02-12 DIAGNOSIS — E039 Hypothyroidism, unspecified: Secondary | ICD-10-CM

## 2019-02-13 MED ORDER — HYDROCHLOROTHIAZIDE 25 MG PO TABS: 25.0000 mg | ORAL_TABLET | Freq: Every day | ORAL | 0 refills | Status: DC

## 2019-02-13 MED ORDER — LEVOTHYROXINE SODIUM 125 MCG PO TABS: 125.0000 ug | ORAL_TABLET | Freq: Every day | ORAL | 0 refills | Status: DC

## 2019-02-23 ENCOUNTER — Telehealth: Payer: Self-pay | Admitting: General Practice

## 2019-02-23 NOTE — Telephone Encounter (Signed)
Pt states that [facility] said she needs to have a new order for her mammogram since Dr. Roxan Hockey is no longer here.

## 2019-02-25 NOTE — Telephone Encounter (Signed)
Chart on provider's desk.  Laura Morale, PA-C to order mammogram.  AMD

## 2019-02-26 ENCOUNTER — Other Ambulatory Visit: Payer: Self-pay | Admitting: Physician Assistant

## 2019-02-26 ENCOUNTER — Encounter: Payer: Self-pay | Admitting: Physician Assistant

## 2019-02-26 ENCOUNTER — Other Ambulatory Visit: Payer: Self-pay

## 2019-02-26 ENCOUNTER — Ambulatory Visit: Payer: 59 | Admitting: Physician Assistant

## 2019-02-26 VITALS — BP 127/76 | HR 71 | Temp 98.4°F | Resp 16 | Wt 242.8 lb

## 2019-02-26 DIAGNOSIS — R319 Hematuria, unspecified: Secondary | ICD-10-CM | POA: Diagnosis not present

## 2019-02-26 DIAGNOSIS — Z1231 Encounter for screening mammogram for malignant neoplasm of breast: Secondary | ICD-10-CM

## 2019-02-26 DIAGNOSIS — M25552 Pain in left hip: Secondary | ICD-10-CM

## 2019-02-26 LAB — POCT URINALYSIS DIPSTICK
Bilirubin, UA: NEGATIVE
Glucose, UA: NEGATIVE
Ketones, UA: NEGATIVE
Leukocytes, UA: NEGATIVE
Nitrite, UA: NEGATIVE
Protein, UA: NEGATIVE
Spec Grav, UA: 1.025 (ref 1.010–1.025)
Urobilinogen, UA: 0.2 E.U./dL
pH, UA: 6 (ref 5.0–8.0)

## 2019-02-26 MED ORDER — KETOROLAC TROMETHAMINE 60 MG/2ML IM SOLN
60.0000 mg | Freq: Once | INTRAMUSCULAR | Status: AC
  Administered 2019-02-26: 60 mg via INTRAMUSCULAR

## 2019-02-26 NOTE — Progress Notes (Signed)
   Subjective:    Patient ID: Laura Walls, female    DOB: [birth year], 58 y.o.   MRN: [id number]  HPI  Patient returns for re-evaluation hematuria previously noted. Denies dysuria, frequency,pelvic pressure or pain. Culture pending see previous notes re possible referral  Has recently started walking program, trying to do 2 miles in 45 minutes a few times a week.  Bought new sneakers and also new Fall shoes-  has spent long periods seated at her desk recently. Over the last few days low back discomfort increasing . Today left low back and left buttocks are particularly uncomfortable- some pain left buttocks No numbness or tingling  Didn't sleep well last night- anxious and discomfort Driving the car and being in heels in the office appear to have aggravated discomfort this morning Denies issue with voiding or defacating, denies saddle numbness or tingling  Previously had knee pain. This has not been an issue recently even with some increased exercise.   Review of Systems Discomfort specific to low left back and left buttocks  Denies other concerns  Needs routine screen mammo ordered     Objective:   Physical Exam Vitals signs and nursing note reviewed.  Constitutional:      Appearance: She is obese. She is not toxic-appearing or diaphoretic.     Comments: Mild/moderate acute distress specific to left low back  Denies numbness or tingling Denies weakness or change in ambulation  Neck:     Musculoskeletal: Normal range of motion.  Musculoskeletal: Normal range of motion.        General: No swelling or tenderness.     Right lower leg: No edema.     Left lower leg: No edema.     Comments: Knees and LEs without significant swelling today Reports history of same Ambulatory On and off table without assistance, hesitates and positions carefully but then comfortable  Skin:    General: Skin is warm and dry.     Capillary Refill: Capillary refill takes less than 2 seconds.   Neurological:     General: No focal deficit present.     Mental Status: She is alert.     Deep Tendon Reflexes: Reflexes normal.   Localized tenderness palpation left sciatic notch,  Aggravated by changes in position     Assessment & Plan:   Left low back pain ; sciatica  Rx Toradol 60 mg IM - tolerated well. 15-20  minute observation without issue Felt some improvement  Ice paks/frozen peas - alternate with heat pad if desired over next few days OTC Rx of preference Stay out of high heels until resolves- 1 " heel with good support recommended  RTC with questions, concerns  Routine screening Mammogram  is ordered, fax submitted  Call back by office  later in the afternoon and she was feeling much improved

## 2019-02-26 NOTE — Progress Notes (Signed)
Here today for repeat urinalysis and urine culture.  Denies pain with urination, denies frequency. Has urgency.  Has pain in left buttock, hip, and left leg she is attributing to new shoes. Rates pain a 7 today.

## 2019-02-27 ENCOUNTER — Encounter: Payer: Self-pay | Admitting: Physician Assistant

## 2019-03-05 ENCOUNTER — Telehealth: Payer: Self-pay | Admitting: Physician Assistant

## 2019-03-05 DIAGNOSIS — R319 Hematuria, unspecified: Secondary | ICD-10-CM

## 2019-03-05 MED ORDER — AMOXICILLIN-POT CLAVULANATE 875-125 MG PO TABS: 1.0000 | ORAL_TABLET | Freq: Two times a day (BID) | ORAL | 0 refills | Status: AC

## 2019-03-05 NOTE — Telephone Encounter (Signed)
Patient seen on 02/26/19 - history of recurrent hematuria Was previously seen with UTI , completed therapy and returned for post Rx repeat culture.  Culture was lost to follow up - located by [facility] and reported today  POSITIVE greater than 100,000 Proteus mirabilis Sensitive cefazolin -- also to  Augmentin which patient has taken before  Contacted patient and she is not having any urinary symptoms at this time - Discussed lab and she agreed to proceed with Augmentin Rx x 5 days Will send to [facility]  Lab report to be scanned into Epic  Will plan to  Check for hematuria and culture urine again following this course of therapy

## 2019-04-15 ENCOUNTER — Ambulatory Visit
Admission: RE | Admit: 2019-04-15 | Discharge: 2019-04-15 | Disposition: A | Discharge status: Home / Self Care | Payer: PRIVATE HEALTH INSURANCE | Source: Ambulatory Visit | Attending: Physician Assistant | Admitting: Physician Assistant

## 2019-04-15 DIAGNOSIS — Z1231 Encounter for screening mammogram for malignant neoplasm of breast: Secondary | ICD-10-CM | POA: Diagnosis not present

## 2019-04-21 ENCOUNTER — Telehealth: Payer: Self-pay

## 2019-04-21 NOTE — Telephone Encounter (Signed)
04/15/2019 received faxed copy of 04/15/2019 mammogram from [facility] Reviewed by Randel Pigg, PA-C (Interim Provider).  AMD

## 2019-05-14 ENCOUNTER — Ambulatory Visit: Payer: PRIVATE HEALTH INSURANCE | Attending: Internal Medicine

## 2019-05-14 ENCOUNTER — Other Ambulatory Visit: Payer: PRIVATE HEALTH INSURANCE

## 2019-05-14 DIAGNOSIS — Z20822 Contact with and (suspected) exposure to covid-19: Secondary | ICD-10-CM

## 2019-05-15 LAB — NOVEL CORONAVIRUS, NAA: SARS-CoV-2, NAA: NOT DETECTED

## 2019-06-24 ENCOUNTER — Other Ambulatory Visit: Payer: Self-pay

## 2019-06-24 DIAGNOSIS — E039 Hypothyroidism, unspecified: Secondary | ICD-10-CM

## 2019-06-24 MED ORDER — LEVOTHYROXINE SODIUM 125 MCG PO TABS: 125.0000 ug | ORAL_TABLET | Freq: Every day | ORAL | 1 refills | Status: DC

## 2019-06-24 NOTE — Telephone Encounter (Signed)
Last labs 12/2018 office visit with Dr Dorris Fetch 01/22/2019  Electronc Rx sent to her pharmacy of choice levothyroxine po daily #90 RF2  Labs and office visit due Oct 2021

## 2019-07-01 ENCOUNTER — Ambulatory Visit: Payer: Self-pay

## 2019-07-02 ENCOUNTER — Ambulatory Visit: Payer: 59 | Admitting: Physician Assistant

## 2019-07-02 ENCOUNTER — Other Ambulatory Visit: Payer: Self-pay

## 2019-07-02 VITALS — BP 138/77 | HR 91 | Temp 98.7°F | Ht 69.0 in | Wt 245.8 lb

## 2019-07-02 DIAGNOSIS — M545 Low back pain, unspecified: Secondary | ICD-10-CM

## 2019-07-02 DIAGNOSIS — R609 Edema, unspecified: Secondary | ICD-10-CM

## 2019-07-02 DIAGNOSIS — M7989 Other specified soft tissue disorders: Secondary | ICD-10-CM

## 2019-07-02 DIAGNOSIS — G8929 Other chronic pain: Secondary | ICD-10-CM

## 2019-07-02 LAB — POCT URINALYSIS DIPSTICK
Bilirubin, UA: NEGATIVE
Blood, UA: POSITIVE
Glucose, UA: NEGATIVE
Ketones, UA: NEGATIVE
Leukocytes, UA: NEGATIVE
Nitrite, UA: NEGATIVE
Protein, UA: NEGATIVE
Spec Grav, UA: 1.015 (ref 1.010–1.025)
Urobilinogen, UA: 0.2 E.U./dL
pH, UA: 6 (ref 5.0–8.0)

## 2019-07-02 MED ORDER — FUROSEMIDE 20 MG PO TABS: 20.0000 mg | ORAL_TABLET | Freq: Every day | ORAL | 0 refills | Status: DC

## 2019-07-02 NOTE — Progress Notes (Signed)
   Subjective: Left flank pain/peripheral edema    Patient ID: Laura Walls, female    DOB: 12-29-1960, 59 y.o.   MRN: [id number]  HPI Patient presents with left flank pain and bilateral lower extremity edema.  Patient denies provocative incident for flank pain.  Patient state has a history of microscopic hematuria.  Patient states flank pain lower extremity edema for 1 month.  Patient denies dyspnea or chest pain.   Review of Systems Negative except for complaint    Objective:   Physical Exam No acute distress.  Obesity.  No obvious lumbar deformity.  No CVA guarding with palpation.  No guarding palpation spinal processes.  Patient has full equal range of motion of the lumbar spine.  Examination of the lower extremities shows bilateral nonpitting edema.       Assessment & Plan: Left flank pain and peripheral edema  Patient with discontinue HCTZ for 5 days.  Start Lasix 20 mg daily.  Patient will follow up in 5 days.

## 2019-07-03 LAB — CBC WITH DIFFERENTIAL/PLATELET
Basophils Absolute: 0.1 10*3/uL (ref 0.0–0.2)
Basos: 1 %
EOS (ABSOLUTE): 0.3 10*3/uL (ref 0.0–0.4)
Eos: 4 %
Hematocrit: 37.1 % (ref 34.0–46.6)
Hemoglobin: 12.2 g/dL (ref 11.1–15.9)
Immature Grans (Abs): 0 10*3/uL (ref 0.0–0.1)
Immature Granulocytes: 0 %
Lymphocytes Absolute: 3.7 10*3/uL — ABNORMAL HIGH (ref 0.7–3.1)
Lymphs: 45 %
MCH: 26.8 pg (ref 26.6–33.0)
MCHC: 32.9 g/dL (ref 31.5–35.7)
MCV: 82 fL (ref 79–97)
Monocytes Absolute: 0.3 10*3/uL (ref 0.1–0.9)
Monocytes: 4 %
Neutrophils Absolute: 3.7 10*3/uL (ref 1.4–7.0)
Neutrophils: 46 %
Platelets: 397 10*3/uL (ref 150–450)
RBC: 4.55 x10E6/uL (ref 3.77–5.28)
RDW: 13.1 % (ref 11.7–15.4)
WBC: 8.1 10*3/uL (ref 3.4–10.8)

## 2019-07-03 LAB — SEDIMENTATION RATE: Sed Rate: 39 mm/hr (ref 0–40)

## 2019-07-03 LAB — URIC ACID: Uric Acid: 6.7 mg/dL (ref 3.0–7.2)

## 2019-07-09 ENCOUNTER — Ambulatory Visit: Payer: Self-pay | Admitting: Physician Assistant

## 2019-07-09 ENCOUNTER — Encounter: Payer: Self-pay | Admitting: Physician Assistant

## 2019-07-09 ENCOUNTER — Other Ambulatory Visit: Payer: Self-pay

## 2019-07-09 VITALS — BP 139/78 | HR 72 | Temp 97.2°F | Resp 12 | Ht 68.0 in | Wt 244.0 lb

## 2019-07-09 DIAGNOSIS — G8929 Other chronic pain: Secondary | ICD-10-CM

## 2019-07-09 DIAGNOSIS — M545 Low back pain, unspecified: Secondary | ICD-10-CM

## 2019-07-09 NOTE — Progress Notes (Signed)
F/U from 07/02/19 visit.  States back not as bad as last week.  States couldn't tell any difference with the Lasix.  AMD

## 2019-07-09 NOTE — Progress Notes (Signed)
   Subjective: Chronic back pain    Patient ID: Laura Walls, female    DOB: 1960-11-06, 59 y.o.   MRN: [id number]  HPI Patient presents for follow-up of chronic back pain.  Patient continues to denies radicular component to her back pain.  Patient was concerned secondary to intermitting hematuria.  Discussed lab results from last visit which was positive for microscopic hematuria.  Patient CBC, uric acid level, and sed rate are unremarkable.   Review of Systems    Hyperlipidemia, hypertension, and hypothyroidism. Objective:   Physical Exam Patient appears in no acute distress.  Sits and stands without reliance on upper extremities.  No obvious spinal deformity.  Patient is moderate guarding palpation of L3-L5.  Patient has full equal range of motion of the lumbar spine.      Assessment & Plan: Chronic back pain  Differential consist of lumbar strain versus degenerative changes of the lumbar spine.  Further evaluation with lumbar spine x-ray is warranted.  Patient follow-up status post lumbar spine x-ray.  Patient advised to continue previous medications and discontinue Lasix.

## 2019-07-10 ENCOUNTER — Ambulatory Visit: Payer: PRIVATE HEALTH INSURANCE | Attending: Internal Medicine

## 2019-07-10 DIAGNOSIS — Z23 Encounter for immunization: Secondary | ICD-10-CM

## 2019-07-10 NOTE — Progress Notes (Signed)
   Covid-19 Vaccination Clinic  Name:  Laura Walls    MRN: [id number] DOB: 08/05/1960  07/10/2019  Ms. Laura Walls was observed post Covid-19 immunization for 15 minutes without incident. She was provided with Vaccine Information Sheet and instruction to access the V-Safe system.   Ms. Laura Walls was instructed to call 911 with any severe reactions post vaccine: Marland Kitchen Difficulty breathing  . Swelling of face and throat  . A fast heartbeat  . A bad rash all over body  . Dizziness and weakness   Immunizations Administered    Name Date Dose VIS Date Route   Pfizer COVID-19 Vaccine 07/10/2019  2:58 PM 0.3 mL 04/03/2019 Intramuscular   Manufacturer: [employer], [employer]   Lot: [id number]   NDC: [address]-2

## 2019-07-31 ENCOUNTER — Ambulatory Visit: Payer: PRIVATE HEALTH INSURANCE | Attending: Internal Medicine

## 2019-07-31 DIAGNOSIS — Z23 Encounter for immunization: Secondary | ICD-10-CM

## 2019-07-31 NOTE — Progress Notes (Signed)
   Covid-19 Vaccination Clinic  Name:  Ayasha Ellingsen    MRN: [id number] DOB: Jan 04, 1961  07/31/2019  Ms. Macwilliams was observed post Covid-19 immunization for 15 minutes without incident. She was provided with Vaccine Information Sheet and instruction to access the V-Safe system.   Ms. Mangus was instructed to call 911 with any severe reactions post vaccine: Marland Kitchen Difficulty breathing  . Swelling of face and throat  . A fast heartbeat  . A bad rash all over body  . Dizziness and weakness   Immunizations Administered    Name Date Dose VIS Date Route   Pfizer COVID-19 Vaccine 07/31/2019  1:56 PM 0.3 mL 04/03/2019 Intramuscular   Manufacturer: [employer], [employer]   Lot: [id number] [id number]   NDC: [address]-1

## 2019-08-03 ENCOUNTER — Other Ambulatory Visit: Payer: Self-pay

## 2019-08-03 DIAGNOSIS — I10 Essential (primary) hypertension: Secondary | ICD-10-CM

## 2019-08-05 ENCOUNTER — Other Ambulatory Visit: Payer: Self-pay

## 2019-08-05 ENCOUNTER — Ambulatory Visit
Admission: RE | Admit: 2019-08-05 | Discharge: 2019-08-05 | Disposition: A | Discharge status: Home / Self Care | Payer: 59 | Source: Ambulatory Visit | Attending: Internal Medicine | Admitting: Internal Medicine

## 2019-08-05 ENCOUNTER — Ambulatory Visit
Admission: RE | Admit: 2019-08-05 | Discharge: 2019-08-05 | Disposition: A | Discharge status: Home / Self Care | Payer: 59 | Source: Ambulatory Visit | Attending: Physician Assistant | Admitting: Physician Assistant

## 2019-08-05 ENCOUNTER — Telehealth: Payer: Self-pay | Admitting: Registered Nurse

## 2019-08-05 ENCOUNTER — Encounter: Payer: Self-pay | Admitting: Registered Nurse

## 2019-08-05 DIAGNOSIS — M545 Low back pain, unspecified: Secondary | ICD-10-CM

## 2019-08-05 DIAGNOSIS — G8929 Other chronic pain: Secondary | ICD-10-CM | POA: Diagnosis not present

## 2019-08-05 MED ORDER — HYDROCHLOROTHIAZIDE 25 MG PO TABS: 25.0000 mg | ORAL_TABLET | Freq: Every day | ORAL | 2 refills | Status: DC

## 2019-08-05 NOTE — Telephone Encounter (Signed)
Request received via fax for hydrochlorothiazide 25mg  po daily.  Last physical 01/22/2019.  BP office visit 139/78 HR 72 BMI 37 with PA Smith 07/09/2019.  Labs 07/02/2019 and BP 138/77 normal CBC.  BP 02/26/2019 with PA 13/08/2018 127/76; CMET 01/13/2019 normal renal/liver function and electrolytes.  Noted on chart review PA 01/15/2019 renewed for patient today 25mg  po daily #90 RF0 electronic Rx sent to patient pharmacy of choice.

## 2019-10-22 ENCOUNTER — Ambulatory Visit: Payer: PRIVATE HEALTH INSURANCE | Attending: Internal Medicine

## 2019-10-22 ENCOUNTER — Other Ambulatory Visit: Payer: PRIVATE HEALTH INSURANCE

## 2019-10-22 ENCOUNTER — Ambulatory Visit: Payer: PRIVATE HEALTH INSURANCE

## 2019-10-22 DIAGNOSIS — Z20822 Contact with and (suspected) exposure to covid-19: Secondary | ICD-10-CM

## 2019-10-23 LAB — SARS-COV-2, NAA 2 DAY TAT

## 2019-10-23 LAB — NOVEL CORONAVIRUS, NAA: SARS-CoV-2, NAA: NOT DETECTED

## 2019-12-20 ENCOUNTER — Other Ambulatory Visit: Payer: Self-pay | Admitting: Registered Nurse

## 2019-12-20 DIAGNOSIS — E039 Hypothyroidism, unspecified: Secondary | ICD-10-CM

## 2019-12-20 DIAGNOSIS — I10 Essential (primary) hypertension: Secondary | ICD-10-CM

## 2019-12-21 MED ORDER — HYDROCHLOROTHIAZIDE 25 MG PO TABS: 25.0000 mg | ORAL_TABLET | Freq: Every day | ORAL | 0 refills | Status: DC

## 2019-12-21 NOTE — Telephone Encounter (Signed)
COB pt

## 2019-12-24 ENCOUNTER — Other Ambulatory Visit: Payer: Self-pay | Admitting: Internal Medicine

## 2019-12-24 DIAGNOSIS — E7849 Other hyperlipidemia: Secondary | ICD-10-CM

## 2019-12-30 ENCOUNTER — Ambulatory Visit: Payer: Self-pay

## 2019-12-30 ENCOUNTER — Other Ambulatory Visit: Payer: Self-pay

## 2019-12-30 DIAGNOSIS — Z0000 Encounter for general adult medical examination without abnormal findings: Secondary | ICD-10-CM

## 2019-12-30 LAB — POCT URINALYSIS DIPSTICK
Bilirubin, UA: NEGATIVE
Blood, UA: POSITIVE
Glucose, UA: NEGATIVE
Ketones, UA: NEGATIVE
Leukocytes, UA: NEGATIVE
Nitrite, UA: NEGATIVE
Protein, UA: NEGATIVE
Spec Grav, UA: 1.02 (ref 1.010–1.025)
Urobilinogen, UA: 0.2 E.U./dL
pH, UA: 6 (ref 5.0–8.0)

## 2019-12-30 NOTE — Progress Notes (Signed)
Scheduled to complete physical 01/05/20

## 2019-12-31 ENCOUNTER — Other Ambulatory Visit: Payer: Self-pay

## 2019-12-31 DIAGNOSIS — Z0000 Encounter for general adult medical examination without abnormal findings: Secondary | ICD-10-CM

## 2019-12-31 LAB — CMP12+LP+TP+TSH+6AC+CBC/D/PLT
ALT: 16 IU/L (ref 0–32)
AST: 18 IU/L (ref 0–40)
Albumin/Globulin Ratio: 1.3 (ref 1.2–2.2)
Albumin: 4 g/dL (ref 3.8–4.8)
Alkaline Phosphatase: 93 IU/L (ref 48–121)
BUN/Creatinine Ratio: 14 (ref 12–28)
BUN: 13 mg/dL (ref 8–27)
Bilirubin Total: 0.4 mg/dL (ref 0.0–1.2)
Calcium: 9.4 mg/dL (ref 8.7–10.3)
Chloride: 100 mmol/L (ref 96–106)
Chol/HDL Ratio: 4.3 ratio (ref 0.0–4.4)
Cholesterol, Total: 155 mg/dL (ref 100–199)
Creatinine, Ser: 0.93 mg/dL (ref 0.57–1.00)
Estimated CHD Risk: 1 times avg. (ref 0.0–1.0)
Free Thyroxine Index: 1.7 (ref 1.2–4.9)
GFR calc Af Amer: 77 mL/min/{1.73_m2} (ref >59)
GFR calc non Af Amer: 67 mL/min/{1.73_m2} (ref >59)
GGT: 26 IU/L (ref 0–60)
Globulin, Total: 3 g/dL (ref 1.5–4.5)
Glucose: 86 mg/dL (ref 65–99)
HDL: 36 mg/dL — ABNORMAL LOW (ref >39)
Iron: 64 ug/dL (ref 27–139)
LDH: 154 IU/L (ref 119–226)
LDL Chol Calc (NIH): 94 mg/dL (ref 0–99)
Phosphorus: 3.5 mg/dL (ref 3.0–4.3)
Potassium: 4.1 mmol/L (ref 3.5–5.2)
Sodium: 139 mmol/L (ref 134–144)
T3 Uptake Ratio: 25 % (ref 24–39)
T4, Total: 6.8 ug/dL (ref 4.5–12.0)
TSH: 7.21 u[IU]/mL — ABNORMAL HIGH (ref 0.450–4.500)
Total Protein: 7 g/dL (ref 6.0–8.5)
Triglycerides: 138 mg/dL (ref 0–149)
Uric Acid: 6 mg/dL (ref 3.0–7.2)
VLDL Cholesterol Cal: 25 mg/dL (ref 5–40)

## 2019-12-31 NOTE — Progress Notes (Signed)
Recollect lavendar tube to complete the CBC portion of the EX1 panel from yesterday (12/30/19) - [facility] reports that the blood clotted & tests couldn't be performed.  AMD

## 2020-01-01 ENCOUNTER — Ambulatory Visit (INDEPENDENT_AMBULATORY_CARE_PROVIDER_SITE_OTHER): Payer: PRIVATE HEALTH INSURANCE | Admitting: Obstetrics and Gynecology

## 2020-01-01 ENCOUNTER — Encounter: Payer: Self-pay | Admitting: Obstetrics and Gynecology

## 2020-01-01 ENCOUNTER — Other Ambulatory Visit: Payer: Self-pay

## 2020-01-01 VITALS — BP 134/74 | HR 71 | Ht 68.0 in | Wt 246.4 lb

## 2020-01-01 DIAGNOSIS — Z01419 Encounter for gynecological examination (general) (routine) without abnormal findings: Secondary | ICD-10-CM | POA: Diagnosis not present

## 2020-01-01 DIAGNOSIS — R5383 Other fatigue: Secondary | ICD-10-CM

## 2020-01-01 DIAGNOSIS — E039 Hypothyroidism, unspecified: Secondary | ICD-10-CM | POA: Diagnosis not present

## 2020-01-01 DIAGNOSIS — Z90710 Acquired absence of both cervix and uterus: Secondary | ICD-10-CM

## 2020-01-01 DIAGNOSIS — I10 Essential (primary) hypertension: Secondary | ICD-10-CM

## 2020-01-01 DIAGNOSIS — Z0000 Encounter for general adult medical examination without abnormal findings: Secondary | ICD-10-CM

## 2020-01-01 DIAGNOSIS — N951 Menopausal and female climacteric states: Secondary | ICD-10-CM

## 2020-01-01 LAB — CBC WITH DIFFERENTIAL/PLATELET
Basophils Absolute: 0.1 10*3/uL (ref 0.0–0.2)
Basos: 1 %
EOS (ABSOLUTE): 0.3 10*3/uL (ref 0.0–0.4)
Eos: 4 %
Hematocrit: 35.6 % (ref 34.0–46.6)
Hemoglobin: 12.1 g/dL (ref 11.1–15.9)
Immature Grans (Abs): 0.1 10*3/uL (ref 0.0–0.1)
Immature Granulocytes: 1 %
Lymphocytes Absolute: 4.1 10*3/uL — ABNORMAL HIGH (ref 0.7–3.1)
Lymphs: 57 %
MCH: 27.3 pg (ref 26.6–33.0)
MCHC: 34 g/dL (ref 31.5–35.7)
MCV: 80 fL (ref 79–97)
Monocytes Absolute: 0.3 10*3/uL (ref 0.1–0.9)
Monocytes: 4 %
Neutrophils Absolute: 2.4 10*3/uL (ref 1.4–7.0)
Neutrophils: 33 %
Platelets: 386 10*3/uL (ref 150–450)
RBC: 4.44 x10E6/uL (ref 3.77–5.28)
RDW: 13.3 % (ref 11.7–15.4)
WBC: 7.2 10*3/uL (ref 3.4–10.8)

## 2020-01-01 NOTE — Progress Notes (Signed)
ANNUAL PREVENTATIVE CARE GYNECOLOGY  ENCOUNTER NOTE  Subjective:       Laura Walls is a 59 y.o. [phone] female here to establish care, and for a routine annual gynecologic exam. She previously relocated from [place], [place] ~ 4 years ago. Has not had any GYN care since that time. Has routine health maintenance with Occupational Health at her job. The patient is not sexually active. The patient has never been on hormone replacement therapy. Patient denies post-menopausal vaginal bleeding. The patient wears seatbelts: yes. The patient participates in regular exercise: yes (walks ~ 2 miles 5 x per week). Has the patient ever been transfused or tattooed?: no.  Current complaints: 1.  No major complaints today .   Gynecologic History No LMP recorded. Patient has had a hysterectomy. Contraception: post menopausal status and has had hysterectomy Last Pap:~ 4 years ago, prior to hysterectomy. Results were normal. Denies h/o abnormal pap smears.  Last mammogram: 04/15/2019. Results were: normal Last Colonoscopy: ~ 2 years ago, reports normal results.  Last Dexa Scan: Patient has never had one.    Obstetric History OB History  Gravida Para Term Preterm AB Living  5 1 1   4 1   SAB TAB Ectopic Multiple Live Births  4       1    # Outcome Date GA Lbr Len/2nd Weight Sex Delivery Anes PTL Lv  5 Term 03/04/96   7 lb 14 oz (3.572 kg)  CS-Unspec   LIV  4 SAB 1995          3 SAB           2 SAB           1 SAB             Past Medical History:  Diagnosis Date  . Carpal tunnel syndrome    both wrist  . Hormone disorder   . Hypertension   . Thyroid disease     Family History  Problem Relation Age of Onset  . Colon cancer Mother   . Leukemia Father   . Breast cancer Neg Hx   . Bladder Cancer Neg Hx   . Kidney cancer Neg Hx     Past Surgical History:  Procedure Laterality Date  . ABDOMINAL HYSTERECTOMY    . APPENDECTOMY    . CARPAL TUNNEL RELEASE    . CESAREAN SECTION    .  HEEL SPUR EXCISION      Social History   Socioeconomic History  . Marital status: Divorced    Spouse name: Not on file  . Number of children: Not on file  . Years of education: Not on file  . Highest education level: Not on file  Occupational History  . Not on file  Tobacco Use  . Smoking status: Never Smoker  . Smokeless tobacco: Never Used  Vaping Use  . Vaping Use: Never used  Substance and Sexual Activity  . Alcohol use: Not Currently  . Drug use: Never  . Sexual activity: Not Currently  Other Topics Concern  . Not on file  Social History Narrative  . Not on file   Social Determinants of Health   Financial Resource Strain:   . Difficulty of Paying Living Expenses: Not on file  Food Insecurity:   . Worried About 13/12/97 in the Last Year: Not on file  . Ran Out of Food in the Last Year: Not on file  Transportation Needs:   . Lack of  Transportation (Medical): Not on file  . Lack of Transportation (Non-Medical): Not on file  Physical Activity:   . Days of Exercise per Week: Not on file  . Minutes of Exercise per Session: Not on file  Stress:   . Feeling of Stress : Not on file  Social Connections:   . Frequency of Communication with Friends and Family: Not on file  . Frequency of Social Gatherings with Friends and Family: Not on file  . Attends Religious Services: Not on file  . Active Member of Clubs or Organizations: Not on file  . Attends Banker Meetings: Not on file  . Marital Status: Not on file  Intimate Partner Violence:   . Fear of Current or Ex-Partner: Not on file  . Emotionally Abused: Not on file  . Physically Abused: Not on file  . Sexually Abused: Not on file    Current Outpatient Medications on File Prior to Visit  Medication Sig Dispense Refill  . atorvastatin (LIPITOR) 10 MG tablet TAKE 1 TABLET (10 MG TOTAL) BY MOUTH DAILY AT 6 PM. 90 tablet 3  . hydrochlorothiazide (HYDRODIURIL) 25 MG tablet Take 1 tablet (25 mg  total) by mouth daily. 90 tablet 0  . levothyroxine (SYNTHROID) 125 MCG tablet TAKE 1 TABLET BY MOUTH EVERY DAY BEFORE BREAKFAST 90 tablet 0   No current facility-administered medications on file prior to visit.    No Known Allergies    Review of Systems ROS Review of Systems - General ROS: negative for - chills, fatigue, fever,  weight gain or weight loss. Positive for hot flashes and night sweats ( ~ 10 years on and off), trouble sleeping, and decreased energy (x 3 weeks) Psychological ROS: negative for - anxiety, decreased libido, depression, mood swings, physical abuse or sexual abuse Ophthalmic ROS: negative for - blurry vision, eye pain or loss of vision ENT ROS: negative for - headaches, hearing change, visual changes or vocal changes Allergy and Immunology ROS: negative for - hives, itchy/watery eyes or seasonal allergies Hematological and Lymphatic ROS: negative for - bleeding problems, bruising, swollen lymph nodes or weight loss Endocrine ROS: negative for - galactorrhea, hair pattern changes, hot flashes, malaise/lethargy, mood swings, palpitations, polydipsia/polyuria, skin changes, temperature intolerance or unexpected weight changes Breast ROS: negative for - new or changing breast lumps or nipple discharge Respiratory ROS: negative for - cough or shortness of breath Cardiovascular ROS: negative for - chest pain, irregular heartbeat, palpitations or shortness of breath Gastrointestinal ROS: no abdominal pain, change in bowel habits, or black or bloody stools Genito-Urinary ROS: no dysuria, trouble voiding, or hematuria Musculoskeletal ROS: negative for - joint pain or joint stiffness Neurological ROS: negative for - bowel and bladder control changes Dermatological ROS: negative for rash and skin lesion changes   Objective:   BP 134/74   Pulse 71   Ht 5\' 8"  (1.727 m)   Wt 246 lb 6.4 oz (111.8 kg)   BMI 37.46 kg/m  CONSTITUTIONAL: Well-developed, well-nourished female  in no acute distress.  PSYCHIATRIC: Normal mood and affect. Normal behavior. Normal judgment and thought content. NEUROLGIC: Alert and oriented to person, place, and time. Normal muscle tone coordination. No cranial nerve deficit noted. HENT:  Normocephalic, atraumatic, External right and left ear normal. Oropharynx is clear and moist EYES: Conjunctivae and EOM are normal. Pupils are equal, round, and reactive to light. No scleral icterus.  NECK: Normal range of motion, supple, no masses.  Normal thyroid.  SKIN: Skin is warm and dry. No  rash noted. Not diaphoretic. No erythema. No pallor. CARDIOVASCULAR: Normal heart rate noted, regular rhythm, no murmur. RESPIRATORY: Clear to auscultation bilaterally. Effort and breath sounds normal, no problems with respiration noted. BREASTS: Symmetric in size. No masses, skin changes, nipple drainage, or lymphadenopathy. ABDOMEN: Soft, normal bowel sounds, no distention noted.  No tenderness, rebound or guarding.  BLADDER: Normal PELVIC:  Bladder no bladder distension noted  Urethra: normal appearing urethra with no masses, tenderness or lesions  Vulva: normal appearing vulva with no masses, tenderness or lesions  Vagina: normal appearing vagina with mild vaginal atrophy. No discharge, no lesions  Cervix: surgically absent  Uterus: surgically absent, vaginal cuff well healed  Adnexa: normal adnexa in size, nontender and no masses  RV: External Exam NormaI, No Rectal Masses and Normal Sphincter tone  MUSCULOSKELETAL: Normal range of motion. No tenderness.  No cyanosis, clubbing. Mild trace edema in left lower extremity.  LYMPHATIC: No Axillary, Supraclavicular, or Inguinal Adenopathy.   Labs: Lab Results  Component Value Date   WBC 7.2 12/31/2019   HGB 12.1 12/31/2019   HCT 35.6 12/31/2019   MCV 80 12/31/2019   PLT 386 12/31/2019    Lab Results  Component Value Date   CREATININE 0.93 12/30/2019   BUN 13 12/30/2019   NA 139 12/30/2019   K  4.1 12/30/2019   CL 100 12/30/2019    Lab Results  Component Value Date   ALT 16 12/30/2019   AST 18 12/30/2019   GGT 26 12/30/2019   ALKPHOS 93 12/30/2019   BILITOT 0.4 12/30/2019    Lab Results  Component Value Date   CHOL 155 12/30/2019   HDL 36 (L) 12/30/2019   LDLCALC 94 12/30/2019   TRIG 138 12/30/2019   CHOLHDL 4.3 12/30/2019    Lab Results  Component Value Date   TSH 7.210 (H) 12/30/2019    No results found for: HGBA1C   Assessment:   1. Encounter for medical examination to establish care   2. Well woman exam with routine gynecological exam   3. History of hysterectomy   4. Hypothyroidism, unspecified type   5. Essential hypertension   6. Menopausal and female climacteric states   7. Fatigue, unspecified type     Plan:  - Pap: Not needed.  Patient has had a hysterectomy.  - Mammogram: Not Ordered. Up to date.  - Stool Guaiac Testing:  Not Ordered. Up to date.  - Labs: Vit D Level and Vit B12 ordered due to patient's complaints of low energy and fatigue.  - Routine preventative health maintenance measures emphasized: Exercise/Diet/Weight control, Tobacco Warnings, Alcohol/Substance use risks, Stress Management and Safe Sex.  - COVID Vaccination status: patient has completed vaccination series.  - Hypothyroidism, levels recently checked, mildly elevated. Plans to see provider in the next several weeks.  - Menopausal vasomotor symptoms.  Patient notes that they are overall mild, has just been dealing with them, but is open to treatment options. Discussed herbal remedies, non-hormonal, and hormonal methods. Patient willing to try herbal remedies first, given list of supplements. Also given a 1 month sample of Evamist if herbal remedies were not beneficial. Given instructions on use. To call back if a prescription desired. Will arrange f/u accordingly.   Return to Clinic - 1 Year or every other year   Hildred Laser, MD [facility]

## 2020-01-01 NOTE — Patient Instructions (Signed)
Health Maintenance for Postmenopausal Women Menopause is a normal process in which your ability to get pregnant comes to an end. This process happens slowly over many months or years, usually between the ages of 48 and 55. Menopause is complete when you have missed your menstrual periods for 12 months. It is important to talk with your health care provider about some of the most common conditions that affect women after menopause (postmenopausal women). These include heart disease, cancer, and bone loss (osteoporosis). Adopting a healthy lifestyle and getting preventive care can help to promote your health and wellness. The actions you take can also lower your chances of developing some of these common conditions. What should I know about menopause? During menopause, you may get a number of symptoms, such as:  Hot flashes. These can be moderate or severe.  Night sweats.  Decrease in sex drive.  Mood swings.  Headaches.  Tiredness.  Irritability.  Memory problems.  Insomnia. Choosing to treat or not to treat these symptoms is a decision that you make with your health care provider. Do I need hormone replacement therapy?  Hormone replacement therapy is effective in treating symptoms that are caused by menopause, such as hot flashes and night sweats.  Hormone replacement carries certain risks, especially as you become older. If you are thinking about using estrogen or estrogen with progestin, discuss the benefits and risks with your health care provider. What is my risk for heart disease and stroke? The risk of heart disease, heart attack, and stroke increases as you age. One of the causes may be a change in the body's hormones during menopause. This can affect how your body uses dietary fats, triglycerides, and cholesterol. Heart attack and stroke are medical emergencies. There are many things that you can do to help prevent heart disease and stroke. Watch your blood pressure  High  blood pressure causes heart disease and increases the risk of stroke. This is more likely to develop in people who have high blood pressure readings, are of African descent, or are overweight.  Have your blood pressure checked: ? Every 3-5 years if you are 18-39 years of age. ? Every year if you are 40 years old or older. Eat a healthy diet   Eat a diet that includes plenty of vegetables, fruits, low-fat dairy products, and lean protein.  Do not eat a lot of foods that are high in solid fats, added sugars, or sodium. Get regular exercise Get regular exercise. This is one of the most important things you can do for your health. Most adults should:  Try to exercise for at least 150 minutes each week. The exercise should increase your heart rate and make you sweat (moderate-intensity exercise).  Try to do strengthening exercises at least twice each week. Do these in addition to the moderate-intensity exercise.  Spend less time sitting. Even light physical activity can be beneficial. Other tips  Work with your health care provider to achieve or maintain a healthy weight.  Do not use any products that contain nicotine or tobacco, such as cigarettes, e-cigarettes, and chewing tobacco. If you need help quitting, ask your health care provider.  Know your numbers. Ask your health care provider to check your cholesterol and your blood sugar (glucose). Continue to have your blood tested as directed by your health care provider. Do I need screening for cancer? Depending on your health history and family history, you may need to have cancer screening at different stages of your life. This   may include screening for:  Breast cancer.  Cervical cancer.  Lung cancer.  Colorectal cancer. What is my risk for osteoporosis? After menopause, you may be at increased risk for osteoporosis. Osteoporosis is a condition in which bone destruction happens more quickly than new bone creation. To help prevent  osteoporosis or the bone fractures that can happen because of osteoporosis, you may take the following actions:  If you are 19-50 years old, get at least 1,000 mg of calcium and at least 600 mg of vitamin D per day.  If you are older than age 50 but younger than age 70, get at least 1,200 mg of calcium and at least 600 mg of vitamin D per day.  If you are older than age 70, get at least 1,200 mg of calcium and at least 800 mg of vitamin D per day. Smoking and drinking excessive alcohol increase the risk of osteoporosis. Eat foods that are rich in calcium and vitamin D, and do weight-bearing exercises several times each week as directed by your health care provider. How does menopause affect my mental health? Depression may occur at any age, but it is more common as you become older. Common symptoms of depression include:  Low or sad mood.  Changes in sleep patterns.  Changes in appetite or eating patterns.  Feeling an overall lack of motivation or enjoyment of activities that you previously enjoyed.  Frequent crying spells. Talk with your health care provider if you think that you are experiencing depression. General instructions See your health care provider for regular wellness exams and vaccines. This may include:  Scheduling regular health, dental, and eye exams.  Getting and maintaining your vaccines. These include: ? Influenza vaccine. Get this vaccine each year before the flu season begins. ? Pneumonia vaccine. ? Shingles vaccine. ? Tetanus, diphtheria, and pertussis (Tdap) booster vaccine. Your health care provider may also recommend other immunizations. Tell your health care provider if you have ever been abused or do not feel safe at home. Summary  Menopause is a normal process in which your ability to get pregnant comes to an end.  This condition causes hot flashes, night sweats, decreased interest in sex, mood swings, headaches, or lack of sleep.  Treatment for this  condition may include hormone replacement therapy.  Take actions to keep yourself healthy, including exercising regularly, eating a healthy diet, watching your weight, and checking your blood pressure and blood sugar levels.  Get screened for cancer and depression. Make sure that you are up to date with all your vaccines. This information is not intended to replace advice given to you by your health care provider. Make sure you discuss any questions you have with your health care provider. Document Revised: 04/02/2018 Document Reviewed: 04/02/2018 Elsevier Patient Education  2020 [employer]    Breast Self-Awareness Breast self-awareness means being familiar with how your breasts look and feel. It involves checking your breasts regularly and reporting any changes to your health care provider. Practicing breast self-awareness is important. Sometimes changes may not be harmful (are benign), but sometimes a change in your breasts can be a sign of a serious medical problem. It is important to learn how to do this procedure correctly so that you can catch problems early, when treatment is more likely to be successful. All women should practice breast self-awareness, including women who have had breast implants. What you need:  A mirror.  A well-lit room. How to do a breast self-exam A breast self-exam is   one way to learn what is normal for your breasts and whether your breasts are changing. To do a breast self-exam: Look for changes  1. Remove all the clothing above your waist. 2. Stand in front of a mirror in a room with good lighting. 3. Put your hands on your hips. 4. Push your hands firmly downward. 5. Compare your breasts in the mirror. Look for differences between them (asymmetry), such as: ? Differences in shape. ? Differences in size. ? Puckers, dips, and bumps in one breast and not the other. 6. Look at each breast for changes in the skin, such as: ? Redness. ? Scaly  areas. 7. Look for changes in your nipples, such as: ? Discharge. ? Bleeding. ? Dimpling. ? Redness. ? A change in position. Feel for changes Carefully feel your breasts for lumps and changes. It is best to do this while lying on your back on the floor, and again while sitting or standing in the tub or shower with soapy water on your skin. Feel each breast in the following way: 1. Place the arm on the side of the breast you are examining above your head. 2. Feel your breast with the other hand. 3. Start in the nipple area and make -inch (2 cm) overlapping circles to feel your breast. Use the pads of your three middle fingers to do this. Apply light pressure, then medium pressure, then firm pressure. The light pressure will allow you to feel the tissue closest to the skin. The medium pressure will allow you to feel the tissue that is a little deeper. The firm pressure will allow you to feel the tissue close to the ribs. 4. Continue the overlapping circles, moving downward over the breast until you feel your ribs below your breast. 5. Move one finger-width toward the center of the body. Continue to use the -inch (2 cm) overlapping circles to feel your breast as you move slowly up toward your collarbone. 6. Continue the up-and-down exam using all three pressures until you reach your armpit.  Write down what you find Writing down what you find can help you remember what to discuss with your health care provider. Write down:  What is normal for each breast.  Any changes that you find in each breast, including: ? The kind of changes you find. ? Any pain or tenderness. ? Size and location of any lumps.  Where you are in your menstrual cycle, if you are still menstruating. General tips and recommendations  Examine your breasts every month.  If you are breastfeeding, the best time to examine your breasts is after a feeding or after using a breast pump.  If you menstruate, the best time to  examine your breasts is 5-7 days after your period. Breasts are generally lumpier during menstrual periods, and it may be more difficult to notice changes.  With time and practice, you will become more familiar with the variations in your breasts and more comfortable with the exam. Contact a health care provider if you:  See a change in the shape or size of your breasts or nipples.  See a change in the skin of your breast or nipples, such as a reddened or scaly area.  Have unusual discharge from your nipples.  Find a lump or thick area that was not there before.  Have pain in your breasts.  Have any concerns related to your breast health. Summary  Breast self-awareness includes looking for physical changes in your breasts, as well   as feeling for any changes within your breasts.  Breast self-awareness should be performed in front of a mirror in a well-lit room.  You should examine your breasts every month. If you menstruate, the best time to examine your breasts is 5-7 days after your menstrual period.  Let your health care provider know of any changes you notice in your breasts, including changes in size, changes on the skin, pain or tenderness, or unusual fluid from your nipples. This information is not intended to replace advice given to you by your health care provider. Make sure you discuss any questions you have with your health care provider. Document Revised: 11/26/2017 Document Reviewed: 11/26/2017 Elsevier Patient Education  2020 [employer] Inc.   Menopause and Hormone Replacement Therapy Menopause is a normal time of life when menstrual periods stop completely and the ovaries stop producing the female hormones estrogen and progesterone. This lack of hormones can affect your health and cause undesirable symptoms. Hormone replacement therapy (HRT) can relieve some of those symptoms. What is hormone replacement therapy? HRT is the use of artificial (synthetic) hormones to  replace hormones that your body has stopped producing because you have reached menopause. What are my options for HRT?  HRT may consist of the synthetic hormones estrogen and progestin, or it may consist of only estrogen (estrogen-only therapy). You and your health care provider will decide which form of HRT is best for you. If you choose to be on HRT and you have a uterus, estrogen and progestin are usually prescribed. Estrogen-only therapy is used for women who do not have a uterus. Possible options for taking HRT include:  Pills.  Patches.  Gels.  Sprays.  Vaginal cream.  Vaginal rings.  Vaginal inserts. The amount of hormone(s) that you take and how long you take the hormone(s) varies according to your health. It is important to:  Begin HRT with the lowest possible dosage.  Stop HRT as soon as your health care provider tells you to stop.  Work with your health care provider so that you feel informed and comfortable with your decisions. What are the benefits of HRT? HRT can reduce the frequency and severity of menopausal symptoms. Benefits of HRT vary according to the kind of symptoms that you have, how severe they are, and your overall health. HRT may help to improve the following symptoms of menopause:  Hot flashes and night sweats. These are sudden feelings of heat that spread over the face and body. The skin may turn red, like a blush. Night sweats are hot flashes that happen while you are sleeping or trying to sleep.  Bone loss (osteoporosis). The body loses calcium more quickly after menopause, causing the bones to become weaker. This can increase the risk for bone breaks (fractures).  Vaginal dryness. The lining of the vagina can become thin and dry, which can cause pain during sex or cause infection, burning, or itching.  Urinary tract infections.  Urinary incontinence. This is the inability to control when you pass urine.  Irritability.  Short-term memory  problems. What are the risks of HRT? Risks of HRT vary depending on your individual health and medical history. Risks of HRT also depend on whether you receive both estrogen and progestin or you receive estrogen only. HRT may increase the risk of:  Spotting. This is when a small amount of blood leaks from the vagina unexpectedly.  Endometrial cancer. This cancer is in the lining of the uterus (endometrium).  Breast cancer.  Increased density   of breast tissue. This can make it harder to find breast cancer on a breast X-ray (mammogram).  Stroke.  Heart disease.  Blood clots.  Gallbladder disease.  Liver disease. Risks of HRT can increase if you have any of the following conditions:  Endometrial cancer.  Liver disease.  Heart disease.  Breast cancer.  History of blood clots.  History of stroke. Follow these instructions at home:  Take over-the-counter and prescription medicines only as told by your health care provider.  Get mammograms, pelvic exams, and medical checkups as often as told by your health care provider.  Have Pap tests done as often as told by your health care provider. A Pap test is sometimes called a Pap smear. It is a screening test that is used to check for signs of cancer of the cervix and vagina. A Pap test can also identify the presence of infection or precancerous changes. Pap tests may be done: ? Every 3 years, starting at age 21. ? Every 5 years, starting after age 30, in combination with testing for human papillomavirus (HPV). ? More often or less often depending on other medical conditions you have, your age, and other risk factors.  It is up to you to get the results of your Pap test. Ask your health care provider, or the department that is doing the test, when your results will be ready.  Keep all follow-up visits as told by your health care provider. This is important. Contact a health care provider if you have:  Pain or swelling in your  legs.  Shortness of breath.  Chest pain.  Lumps or changes in your breasts or armpits.  Slurred speech.  Pain, burning, or bleeding when you urinate.  Unusual vaginal bleeding.  Dizziness or headaches.  Weakness or numbness in any part of your arms or legs.  Pain in your abdomen. Summary  Menopause is a normal time of life when menstrual periods stop completely and the ovaries stop producing the female hormones estrogen and progesterone.  Hormone replacement therapy (HRT) can relieve some of the symptoms of menopause.  HRT can reduce the frequency and severity of menopausal symptoms.  Risks of HRT vary depending on your individual health and medical history. This information is not intended to replace advice given to you by your health care provider. Make sure you discuss any questions you have with your health care provider. Document Revised: 12/10/2017 Document Reviewed: 12/10/2017 Elsevier Patient Education  2020 [employer] Inc.  

## 2020-01-01 NOTE — Progress Notes (Signed)
Pt present to est care. Pt stated that she was doing well no problems.  

## 2020-01-02 LAB — VITAMIN D 25 HYDROXY (VIT D DEFICIENCY, FRACTURES): Vit D, 25-Hydroxy: 20.6 ng/mL — ABNORMAL LOW (ref 30.0–100.0)

## 2020-01-02 LAB — VITAMIN B12: Vitamin B-12: 373 pg/mL (ref 232–1245)

## 2020-01-02 MED ORDER — VITAMIN D (ERGOCALCIFEROL) 1.25 MG (50000 UNIT) PO CAPS: 50000.0000 [IU] | ORAL_CAPSULE | ORAL | 0 refills | Status: DC

## 2020-01-03 ENCOUNTER — Encounter: Payer: Self-pay | Admitting: Obstetrics and Gynecology

## 2020-01-11 ENCOUNTER — Ambulatory Visit: Payer: PRIVATE HEALTH INSURANCE | Admitting: Obstetrics & Gynecology

## 2020-01-12 ENCOUNTER — Encounter: Payer: Self-pay | Admitting: Nurse Practitioner

## 2020-01-13 ENCOUNTER — Other Ambulatory Visit: Payer: Self-pay

## 2020-01-13 ENCOUNTER — Ambulatory Visit: Payer: Self-pay | Admitting: Physician Assistant

## 2020-01-13 ENCOUNTER — Encounter: Payer: Self-pay | Admitting: Physician Assistant

## 2020-01-13 VITALS — BP 135/70 | HR 72 | Temp 98.4°F | Resp 16 | Ht 68.5 in | Wt 244.0 lb

## 2020-01-13 DIAGNOSIS — E559 Vitamin D deficiency, unspecified: Secondary | ICD-10-CM

## 2020-01-13 DIAGNOSIS — Z0000 Encounter for general adult medical examination without abnormal findings: Secondary | ICD-10-CM

## 2020-01-13 MED ORDER — VITAMIN D (ERGOCALCIFEROL) 1.25 MG (50000 UNIT) PO CAPS: 50000.0000 [IU] | ORAL_CAPSULE | ORAL | 0 refills | Status: DC

## 2020-01-13 MED ORDER — NAPROXEN 500 MG PO TABS: 500.0000 mg | ORAL_TABLET | Freq: Two times a day (BID) | ORAL | 0 refills | Status: DC

## 2020-01-13 NOTE — Progress Notes (Signed)
   Subjective: Physical exam    Patient ID: Laura Walls, female    DOB: 10/10/1960, 59 y.o.   MRN: [id number]  HPI Patient presents for annual physical exam.  Patient was concerned for intermitting numbness/tingling in the left wrist.  Patient has history of carpal tunnel to the right wrist which was surgically corrected a few years ago.  Patient is right-hand dominant.   Review of Systems Hyperlipidemia, hypertension, hypothyroidism, and vitamin D deficiency.    Objective:   Physical Exam No acute distress.  HEENT is unremarkable.  Neck is supple for adenopathy or bruits.  Lungs are clear to auscultation.  Heart is regular rate and rhythm.  Abdomen is negative HSM, normoactive bowel sounds, soft and nontender to palpation.  No obvious deformity to the upper extremities.  Patient has full and equal range of motion of the upper extremities.  Patient has a positive Tinel at the left wrist.  No obvious cervical or lumbar spine deformity.  Patient has full and equal range of motion of the cervical and lumbar spine.  Cranial nerves II through XII are grossly intact.      Assessment & Plan: Well exam  Patient exam is consistent with left carpal tunnel syndrome.  Patient lab results revealed vitamin D deficiency.  Patient advised to continue wearing wrist splint at night and a prescription for naproxen and vitamin D be sent to the pharmacy.  Patient advised follow-up in 1 month if continued cough, no complaint.

## 2020-01-13 NOTE — Progress Notes (Signed)
Pt presented today to complete physical with ron and is requesting her vitamin D to be refilled. The Original prescriber is her OBGYN. CL,RMA

## 2020-02-11 ENCOUNTER — Telehealth: Payer: Self-pay

## 2020-02-11 NOTE — Telephone Encounter (Signed)
Patient called in stating that she received a invoice from [facility] charging her for a B-12 test however she doesn't recall having this done.  Could you please advise?

## 2020-02-17 NOTE — Telephone Encounter (Signed)
Please advise. Thanks [place] 

## 2020-02-17 NOTE — Telephone Encounter (Signed)
Pt aware [facility] ordered vit b an vit d due to low energy and fatigue.

## 2020-03-18 ENCOUNTER — Other Ambulatory Visit: Payer: Self-pay | Admitting: Emergency Medicine

## 2020-03-18 DIAGNOSIS — E039 Hypothyroidism, unspecified: Secondary | ICD-10-CM

## 2020-03-21 ENCOUNTER — Other Ambulatory Visit: Payer: Self-pay | Admitting: Nurse Practitioner

## 2020-03-21 DIAGNOSIS — G5600 Carpal tunnel syndrome, unspecified upper limb: Secondary | ICD-10-CM

## 2020-03-21 DIAGNOSIS — E039 Hypothyroidism, unspecified: Secondary | ICD-10-CM

## 2020-03-21 DIAGNOSIS — E559 Vitamin D deficiency, unspecified: Secondary | ICD-10-CM

## 2020-03-21 MED ORDER — LEVOTHYROXINE SODIUM 125 MCG PO TABS: ORAL_TABLET | ORAL | 0 refills | Status: DC

## 2020-03-21 NOTE — Progress Notes (Signed)
Patient called requesting refill on Vitamin D. Was given 7 days of 50,000 in September. Also received auto request for synthroid- last TSH was elevated.   Also notes when she was seen in September was given a brace for Carpal Tunnel- pain has persisted now unable to sleep due to pain, would like to see Neurology.   Will place referral to Neurology, repeat Vit D and Thyroid panel and will address when results have returned

## 2020-03-21 NOTE — Progress Notes (Signed)
Patient requested Synthroid refill. TSH elevated at last draw in September. Request recheck of Vitamin D and TSH patient made an appointment for this week.   Will provide one week refill until results of labs are confirmed.

## 2020-03-22 NOTE — Progress Notes (Signed)
Patient requested Synthroid refill. TSH elevated at last draw in September. Request recheck of Vitamin D and TSH per Maralyn Sago Parker,FN-P

## 2020-03-23 ENCOUNTER — Other Ambulatory Visit: Payer: Self-pay

## 2020-03-23 DIAGNOSIS — E559 Vitamin D deficiency, unspecified: Secondary | ICD-10-CM

## 2020-03-23 DIAGNOSIS — E039 Hypothyroidism, unspecified: Secondary | ICD-10-CM

## 2020-03-24 ENCOUNTER — Encounter: Payer: Self-pay | Admitting: Nurse Practitioner

## 2020-03-24 ENCOUNTER — Other Ambulatory Visit: Payer: Self-pay | Admitting: Nurse Practitioner

## 2020-03-24 DIAGNOSIS — E039 Hypothyroidism, unspecified: Secondary | ICD-10-CM

## 2020-03-24 LAB — THYROID PANEL WITH TSH
Free Thyroxine Index: 2.4 (ref 1.2–4.9)
T3 Uptake Ratio: 27 % (ref 24–39)
T4, Total: 9 ug/dL (ref 4.5–12.0)
TSH: 4.07 u[IU]/mL (ref 0.450–4.500)

## 2020-03-24 LAB — VITAMIN D 25 HYDROXY (VIT D DEFICIENCY, FRACTURES): Vit D, 25-Hydroxy: 61.6 ng/mL (ref 30.0–100.0)

## 2020-03-24 MED ORDER — LEVOTHYROXINE SODIUM 125 MCG PO TABS: ORAL_TABLET | ORAL | 3 refills | Status: DC

## 2020-03-31 ENCOUNTER — Ambulatory Visit: Payer: 59

## 2020-04-01 ENCOUNTER — Ambulatory Visit: Payer: Self-pay | Admitting: Physician Assistant

## 2020-04-01 ENCOUNTER — Other Ambulatory Visit: Payer: Self-pay

## 2020-04-01 ENCOUNTER — Encounter: Payer: Self-pay | Admitting: Physician Assistant

## 2020-04-01 VITALS — BP 118/63 | HR 74 | Temp 98.3°F | Resp 14 | Ht 68.0 in | Wt 240.0 lb

## 2020-04-01 DIAGNOSIS — E039 Hypothyroidism, unspecified: Secondary | ICD-10-CM

## 2020-04-01 DIAGNOSIS — E559 Vitamin D deficiency, unspecified: Secondary | ICD-10-CM

## 2020-04-01 MED ORDER — LEVOTHYROXINE SODIUM 125 MCG PO TABS: 125.0000 ug | ORAL_TABLET | Freq: Every day | ORAL | 3 refills | Status: DC

## 2020-04-01 MED ORDER — VITAMIN D (ERGOCALCIFEROL) 1.25 MG (50000 UNIT) PO CAPS: 50000.0000 [IU] | ORAL_CAPSULE | ORAL | 0 refills | Status: DC

## 2020-04-01 NOTE — Progress Notes (Signed)
   Subjective: Hypothyroidism and vitamin D deficiency    Patient ID: Laura Walls, female    DOB: Jul 22, 1960, 59 y.o.   MRN: [id number]  HPI Patient presents today to discuss lab results for hypothyroidism and vitamin D deficiency.  Patient has made remarkable improvement in her level status post starting Synthroid and vitamin D supplement.  Patient still voiced concerns about carpal tunnel which we addressed with a special appointment next month.   Review of Systems    Hypothyroidism and vitamin D deficiency. Objective:   Physical Exam  Deferred      Assessment & Plan: Hypothyroidism and vitamin D deficiency.  Patient given new prescription for Synthroid 125 mcg and vitamin D supplement.  Patient follow-up in 6 months.

## 2020-04-12 ENCOUNTER — Encounter: Payer: PRIVATE HEALTH INSURANCE | Admitting: Obstetrics and Gynecology

## 2020-05-02 ENCOUNTER — Other Ambulatory Visit: Payer: Self-pay | Admitting: Physician Assistant

## 2020-05-19 DIAGNOSIS — E559 Vitamin D deficiency, unspecified: Secondary | ICD-10-CM | POA: Diagnosis not present

## 2020-05-19 DIAGNOSIS — R200 Anesthesia of skin: Secondary | ICD-10-CM | POA: Diagnosis not present

## 2020-05-19 DIAGNOSIS — E519 Thiamine deficiency, unspecified: Secondary | ICD-10-CM | POA: Diagnosis not present

## 2020-05-19 DIAGNOSIS — R202 Paresthesia of skin: Secondary | ICD-10-CM | POA: Diagnosis not present

## 2020-05-19 DIAGNOSIS — E538 Deficiency of other specified B group vitamins: Secondary | ICD-10-CM | POA: Diagnosis not present

## 2020-05-19 DIAGNOSIS — E531 Pyridoxine deficiency: Secondary | ICD-10-CM | POA: Diagnosis not present

## 2020-07-10 ENCOUNTER — Encounter: Payer: Self-pay | Admitting: Emergency Medicine

## 2020-07-10 ENCOUNTER — Emergency Department: Payer: PRIVATE HEALTH INSURANCE

## 2020-07-10 ENCOUNTER — Emergency Department
Admission: EM | Admit: 2020-07-10 | Discharge: 2020-07-10 | Disposition: A | Discharge status: Home / Self Care | Payer: PRIVATE HEALTH INSURANCE | Attending: Emergency Medicine | Admitting: Emergency Medicine

## 2020-07-10 ENCOUNTER — Other Ambulatory Visit: Payer: Self-pay

## 2020-07-10 DIAGNOSIS — Z79899 Other long term (current) drug therapy: Secondary | ICD-10-CM | POA: Insufficient documentation

## 2020-07-10 DIAGNOSIS — R2242 Localized swelling, mass and lump, left lower limb: Secondary | ICD-10-CM | POA: Insufficient documentation

## 2020-07-10 DIAGNOSIS — R079 Chest pain, unspecified: Secondary | ICD-10-CM

## 2020-07-10 DIAGNOSIS — E039 Hypothyroidism, unspecified: Secondary | ICD-10-CM | POA: Insufficient documentation

## 2020-07-10 DIAGNOSIS — I10 Essential (primary) hypertension: Secondary | ICD-10-CM | POA: Diagnosis not present

## 2020-07-10 DIAGNOSIS — R0789 Other chest pain: Secondary | ICD-10-CM | POA: Diagnosis not present

## 2020-07-10 LAB — CBC
HCT: 33.2 % — ABNORMAL LOW (ref 36.0–46.0)
Hemoglobin: 11.2 g/dL — ABNORMAL LOW (ref 12.0–15.0)
MCH: 27.1 pg (ref 26.0–34.0)
MCHC: 33.7 g/dL (ref 30.0–36.0)
MCV: 80.4 fL (ref 80.0–100.0)
Platelets: 339 10*3/uL (ref 150–400)
RBC: 4.13 MIL/uL (ref 3.87–5.11)
RDW: 13.4 % (ref 11.5–15.5)
WBC: 5.4 10*3/uL (ref 4.0–10.5)
nRBC: 0 % (ref 0.0–0.2)

## 2020-07-10 LAB — BASIC METABOLIC PANEL
Anion gap: 5 (ref 5–15)
BUN: 12 mg/dL (ref 8–23)
CO2: 26 mmol/L (ref 22–32)
Calcium: 8.5 mg/dL — ABNORMAL LOW (ref 8.9–10.3)
Chloride: 107 mmol/L (ref 98–111)
Creatinine, Ser: 0.88 mg/dL (ref 0.44–1.00)
GFR, Estimated: >60 mL/min (ref >60)
Glucose, Bld: 97 mg/dL (ref 70–99)
Potassium: 3.8 mmol/L (ref 3.5–5.1)
Sodium: 138 mmol/L (ref 135–145)

## 2020-07-10 LAB — TROPONIN I (HIGH SENSITIVITY)
Troponin I (High Sensitivity): 3 ng/L (ref <18)
Troponin I (High Sensitivity): <2 ng/L (ref <18)

## 2020-07-10 LAB — D-DIMER, QUANTITATIVE: D-Dimer, Quant: 0.47 ug/mL-FEU (ref 0.00–0.50)

## 2020-07-10 NOTE — ED Provider Notes (Signed)
[facility] Emergency Department Provider Note   ____________________________________________   Event Date/Time   First MD Initiated Contact with Patient 07/10/20 1118     (approximate)  I have reviewed the triage vital signs and the nursing notes.   HISTORY  Chief Complaint Chest Pain    HPI Laura Walls is a 60 y.o. female with past medical history of hypertension and hyperlipidemia who presents to the ED complaining of chest pain.  Patient reports that she had sudden onset of pain in the left side of her chest around 930 when she was doing dishes.  She describes it as a sharp and tight feeling pain that has been constant since onset, although has slightly eased off.  She denies any associated fever, cough, or shortness of breath.  She did notice earlier in the week that her left foot and lower leg was swollen with some mild discomfort, she denies any injury to her left leg.  She does not take any hormonal medication and denies any recent long trips.  She has never had a clot in her legs or lungs.        Past Medical History:  Diagnosis Date  . Carpal tunnel syndrome    both wrist  . Hormone disorder   . Hypertension   . Thyroid disease     Patient Active Problem List   Diagnosis Date Noted  . Vitamin D deficiency 01/13/2020  . Hyperlipidemia 01/22/2019  . Hypothyroid 01/22/2019  . Breast cancer screening by mammogram 01/22/2019  . Essential hypertension 01/22/2019  . Acute pain of left knee 01/22/2019  . Hematuria 01/22/2019  . Class 2 severe obesity due to excess calories with serious comorbidity and body mass index (BMI) of 36.0 to 36.9 in adult (HCC) 01/22/2019  . Lower extremity edema 01/22/2019  . Family history of colon cancer in mother 01/22/2019    Past Surgical History:  Procedure Laterality Date  . ABDOMINAL HYSTERECTOMY    . APPENDECTOMY    . CARPAL TUNNEL RELEASE    . CESAREAN SECTION    . HEEL SPUR EXCISION       Prior to Admission medications   Medication Sig Start Date End Date Taking? Authorizing Provider  atorvastatin (LIPITOR) 10 MG tablet TAKE 1 TABLET (10 MG TOTAL) BY MOUTH DAILY AT 6 PM. 12/24/19   Joni Reining, PA-C  hydrochlorothiazide (HYDRODIURIL) 25 MG tablet Take 1 tablet (25 mg total) by mouth daily. 12/21/19   Emily Filbert, MD  levothyroxine (SYNTHROID) 125 MCG tablet Take 1 tablet (125 mcg total) by mouth daily. 04/01/20   Joni Reining, PA-C  naproxen (NAPROSYN) 500 MG tablet Take 1 tablet (500 mg total) by mouth 2 (two) times daily with a meal. 01/13/20   Joni Reining, PA-C  Vitamin D, Ergocalciferol, (DRISDOL) 1.25 MG (50000 UNIT) CAPS capsule Take 1 capsule (50,000 Units total) by mouth every 7 (seven) days. 04/01/20   Joni Reining, PA-C    Allergies Patient has no known allergies.  Family History  Problem Relation Age of Onset  . Colon cancer Mother   . Leukemia Father   . Breast cancer Neg Hx   . Bladder Cancer Neg Hx   . Kidney cancer Neg Hx     Social History Social History   Tobacco Use  . Smoking status: Never Smoker  . Smokeless tobacco: Never Used  Vaping Use  . Vaping Use: Never used  Substance Use Topics  . Alcohol use: Not Currently  .  Drug use: Never    Review of Systems  Constitutional: No fever/chills Eyes: No visual changes. ENT: No sore throat. Cardiovascular: Positive for chest pain. Respiratory: Denies shortness of breath. Gastrointestinal: No abdominal pain.  No nausea, no vomiting.  No diarrhea.  No constipation. Genitourinary: Negative for dysuria. Musculoskeletal: Negative for back pain.  Positive for left leg swelling. Skin: Negative for rash. Neurological: Negative for headaches, focal weakness or numbness.  ____________________________________________   PHYSICAL EXAM:  VITAL SIGNS: ED Triage Vitals  Enc Vitals Group     BP 07/10/20 1107 (!) 157/80     Pulse Rate 07/10/20 1107 89     Resp 07/10/20 1107  18     Temp 07/10/20 1107 98.4 F (36.9 C)     Temp Source 07/10/20 1107 Oral     SpO2 07/10/20 1107 98 %     Weight 07/10/20 1108 240 lb (108.9 kg)     Height 07/10/20 1108 5\' 8"  (1.727 m)     Head Circumference --      Peak Flow --      Pain Score 07/10/20 1107 8     Pain Loc --      Pain Edu? --      Excl. in GC? --     Constitutional: Alert and oriented. Eyes: Conjunctivae are normal. Head: Atraumatic. Nose: No congestion/rhinnorhea. Mouth/Throat: Mucous membranes are moist. Neck: Normal ROM Cardiovascular: Normal rate, regular rhythm. Grossly normal heart sounds. Respiratory: Normal respiratory effort.  No retractions. Lungs CTAB. Gastrointestinal: Soft and nontender. No distention. Genitourinary: deferred Musculoskeletal: Trace pitting edema to left mid shin with mild calf tenderness but no erythema or warmth.  No right lower extremity edema or tenderness. Neurologic:  Normal speech and language. No gross focal neurologic deficits are appreciated. Skin:  Skin is warm, dry and intact. No rash noted. Psychiatric: Mood and affect are normal. Speech and behavior are normal.  ____________________________________________   LABS (all labs ordered are listed, but only abnormal results are displayed)  Labs Reviewed  BASIC METABOLIC PANEL - Abnormal; Notable for the following components:      Result Value   Calcium 8.5 (*)    All other components within normal limits  CBC - Abnormal; Notable for the following components:   Hemoglobin 11.2 (*)    HCT 33.2 (*)    All other components within normal limits  D-DIMER, QUANTITATIVE  TROPONIN I (HIGH SENSITIVITY)  TROPONIN I (HIGH SENSITIVITY)   ____________________________________________  EKG  ED ECG REPORT I, 07/12/20, the attending physician, personally viewed and interpreted this ECG.   Date: 07/10/2020  EKG Time: 11:02  Rate: 96  Rhythm: normal sinus rhythm  Axis: Normal  Intervals:left anterior fascicular  block  ST&T Change: None   PROCEDURES  Procedure(s) performed (including Critical Care):  Procedures   ____________________________________________   INITIAL IMPRESSION / ASSESSMENT AND PLAN / ED COURSE       60 year old female with past medical history of hypertension and hyperlipidemia who presents to the ED with chest tightness and stabbing pain starting around 930 this morning while she was doing dishes.  Pain has eased off but is still present, vital signs are reassuring.  EKG shows no evidence of arrhythmia or ischemia and symptoms sound atypical for ACS.  We will screen 2 sets of troponin, also assess for DVT/PE with D-dimer.  She is low risk by Wells and if D-dimer is within normal limits, I doubt PE.  D-dimer within normal limits and repeat troponin is  negative.  Chest x-ray reviewed by me and shows no infiltrate, edema, or effusion.  Patient is chest pain-free at this time and is appropriate for discharge home with PCP follow-up.  She was counseled to return to the ED for any new or worsening symptoms, patient agrees with plan.      ____________________________________________   FINAL CLINICAL IMPRESSION(S) / ED DIAGNOSES  Final diagnoses:  Nonspecific chest pain     ED Discharge Orders    None       Note:  This document was prepared using Dragon voice recognition software and may include unintentional dictation errors.   Chesley Noon, MD 07/10/20 [phone]

## 2020-07-10 NOTE — ED Triage Notes (Signed)
Patient presents to the ED with left upper chest pain that began around 9:30 when she was doing dishes.  Patient states she had to sit down.  Patient states pain was a "tightness" in her chest.  Denies any radiation.  Patient reports some shortness of breath at the time and states she felt better leaning forward.  Patient states she is currently feeling better, the worst of the chest pain lasted approx. 20-30 min.  Patient denies history of similar pain and denies cardiac history.

## 2020-07-12 ENCOUNTER — Other Ambulatory Visit: Payer: Self-pay

## 2020-07-12 DIAGNOSIS — I10 Essential (primary) hypertension: Secondary | ICD-10-CM

## 2020-07-13 MED ORDER — HYDROCHLOROTHIAZIDE 25 MG PO TABS: 25.0000 mg | ORAL_TABLET | Freq: Every day | ORAL | 1 refills | Status: DC

## 2020-08-19 NOTE — Progress Notes (Signed)
Scheduled to complete physical 08/29/20 with Ron Smith, PA-C.  AMD 

## 2020-08-22 ENCOUNTER — Other Ambulatory Visit: Payer: Self-pay

## 2020-08-22 ENCOUNTER — Ambulatory Visit: Payer: Self-pay

## 2020-08-22 DIAGNOSIS — Z0000 Encounter for general adult medical examination without abnormal findings: Secondary | ICD-10-CM

## 2020-08-22 LAB — POCT URINALYSIS DIPSTICK
Bilirubin, UA: NEGATIVE
Blood, UA: POSITIVE
Glucose, UA: NEGATIVE
Ketones, UA: NEGATIVE
Leukocytes, UA: NEGATIVE
Nitrite, UA: NEGATIVE
Protein, UA: NEGATIVE
Spec Grav, UA: 1.015 (ref 1.010–1.025)
Urobilinogen, UA: 0.2 E.U./dL
pH, UA: 6 (ref 5.0–8.0)

## 2020-08-23 LAB — CMP12+LP+TP+TSH+6AC+CBC/D/PLT
ALT: 10 IU/L (ref 0–32)
AST: 15 IU/L (ref 0–40)
Albumin/Globulin Ratio: 1.3 (ref 1.2–2.2)
Albumin: 4.2 g/dL (ref 3.8–4.8)
Alkaline Phosphatase: 99 IU/L (ref 44–121)
BUN/Creatinine Ratio: 18 (ref 12–28)
BUN: 18 mg/dL (ref 8–27)
Basophils Absolute: 0.1 10*3/uL (ref 0.0–0.2)
Basos: 1 %
Bilirubin Total: 0.5 mg/dL (ref 0.0–1.2)
Calcium: 9.3 mg/dL (ref 8.7–10.3)
Chloride: 99 mmol/L (ref 96–106)
Chol/HDL Ratio: 4.2 ratio (ref 0.0–4.4)
Cholesterol, Total: 171 mg/dL (ref 100–199)
Creatinine, Ser: 0.98 mg/dL (ref 0.57–1.00)
EOS (ABSOLUTE): 0.2 10*3/uL (ref 0.0–0.4)
Eos: 3 %
Estimated CHD Risk: 0.9 times avg. (ref 0.0–1.0)
Free Thyroxine Index: 2.4 (ref 1.2–4.9)
GGT: 29 IU/L (ref 0–60)
Globulin, Total: 3.3 g/dL (ref 1.5–4.5)
Glucose: 95 mg/dL (ref 65–99)
HDL: 41 mg/dL (ref >39)
Hematocrit: 38.9 % (ref 34.0–46.6)
Hemoglobin: 12.5 g/dL (ref 11.1–15.9)
Immature Grans (Abs): 0.1 10*3/uL (ref 0.0–0.1)
Immature Granulocytes: 1 %
Iron: 79 ug/dL (ref 27–139)
LDH: 160 IU/L (ref 119–226)
LDL Chol Calc (NIH): 110 mg/dL — ABNORMAL HIGH (ref 0–99)
Lymphocytes Absolute: 3.1 10*3/uL (ref 0.7–3.1)
Lymphs: 45 %
MCH: 26.6 pg (ref 26.6–33.0)
MCHC: 32.1 g/dL (ref 31.5–35.7)
MCV: 83 fL (ref 79–97)
Monocytes Absolute: 0.3 10*3/uL (ref 0.1–0.9)
Monocytes: 4 %
Neutrophils Absolute: 3.2 10*3/uL (ref 1.4–7.0)
Neutrophils: 46 %
Phosphorus: 3.8 mg/dL (ref 3.0–4.3)
Platelets: 382 10*3/uL (ref 150–450)
Potassium: 3.7 mmol/L (ref 3.5–5.2)
RBC: 4.7 x10E6/uL (ref 3.77–5.28)
RDW: 13.3 % (ref 11.7–15.4)
Sodium: 143 mmol/L (ref 134–144)
T3 Uptake Ratio: 26 % (ref 24–39)
T4, Total: 9.3 ug/dL (ref 4.5–12.0)
TSH: 3.83 u[IU]/mL (ref 0.450–4.500)
Total Protein: 7.5 g/dL (ref 6.0–8.5)
Triglycerides: 111 mg/dL (ref 0–149)
Uric Acid: 6.8 mg/dL (ref 3.0–7.2)
VLDL Cholesterol Cal: 20 mg/dL (ref 5–40)
WBC: 7 10*3/uL (ref 3.4–10.8)
eGFR: 66 mL/min/{1.73_m2} (ref >59)

## 2020-08-29 ENCOUNTER — Other Ambulatory Visit: Payer: Self-pay

## 2020-08-29 ENCOUNTER — Encounter: Payer: Self-pay | Admitting: Physician Assistant

## 2020-08-29 ENCOUNTER — Ambulatory Visit: Payer: Self-pay | Admitting: Physician Assistant

## 2020-08-29 VITALS — BP 145/75 | HR 80 | Temp 98.7°F | Ht 68.0 in | Wt 249.0 lb

## 2020-08-29 DIAGNOSIS — Z0000 Encounter for general adult medical examination without abnormal findings: Secondary | ICD-10-CM

## 2020-08-29 NOTE — Progress Notes (Signed)
   Subjective: Annual physical exam    Patient ID: Laura Walls, female    DOB: 02-Dec-1960, 60 y.o.   MRN: [id number]  HPI Patient presents annual physical exam.  No concern except for weight gain.   Review of Systems    Hyperlipidemia, hypertension, and hypothyroidism. Objective:   Physical Exam No acute distress.  Temperature is 98.7, pulse 80, BP is 145/75, patient 99% O2 sat on room air.  Patient BMI is 37.86. HEENT was unremarkable.  Neck was supple for adenopathy or bruits.  Lungs are clear to auscultation.  Heart regular rate and rhythm. Abdomen is negative HSM, normoactive bowel sounds, soft, nontender to palpation. No obvious deformity to the upper or lower extremities.  Patient has full and equal range of motion upper and lower extremities. No obvious deformity to cervical lumbar spine.  Patient full and equal range of motion cervical lumbar spine. Cranial nerves II through XII are grossly intact.       Assessment & Plan: Well exam.  Discussed lab results with patient and advised continue previous medications.  Follow-up as needed.

## 2020-08-30 DIAGNOSIS — G5602 Carpal tunnel syndrome, left upper limb: Secondary | ICD-10-CM | POA: Diagnosis not present

## 2020-08-30 DIAGNOSIS — Z9889 Other specified postprocedural states: Secondary | ICD-10-CM | POA: Diagnosis not present

## 2020-11-08 ENCOUNTER — Ambulatory Visit: Payer: Self-pay | Admitting: Physician Assistant

## 2020-11-08 ENCOUNTER — Other Ambulatory Visit: Payer: Self-pay | Admitting: Physician Assistant

## 2020-11-08 ENCOUNTER — Encounter: Payer: Self-pay | Admitting: Physician Assistant

## 2020-11-08 ENCOUNTER — Other Ambulatory Visit: Payer: Self-pay

## 2020-11-08 VITALS — BP 142/75 | Temp 97.7°F | Resp 14 | Ht 69.0 in | Wt 240.0 lb

## 2020-11-08 DIAGNOSIS — S39012A Strain of muscle, fascia and tendon of lower back, initial encounter: Secondary | ICD-10-CM

## 2020-11-08 MED ORDER — LEVOTHYROXINE SODIUM 125 MCG PO TABS: 125.0000 ug | ORAL_TABLET | Freq: Every day | ORAL | 3 refills | Status: DC

## 2020-11-08 MED ORDER — ORPHENADRINE CITRATE ER 100 MG PO TB12
100.0000 mg | ORAL_TABLET | Freq: Two times a day (BID) | ORAL | 0 refills | Status: DC
Start: 2020-11-08 — End: 2021-04-26

## 2020-11-08 MED ORDER — NAPROXEN 500 MG PO TABS
500.0000 mg | ORAL_TABLET | Freq: Two times a day (BID) | ORAL | 0 refills | Status: DC
Start: 2020-11-08 — End: 2021-04-26

## 2020-11-08 NOTE — Progress Notes (Signed)
   Subjective: Back pain    Patient ID: Laura Walls, female    DOB: Oct 29, 1960, 60 y.o.   MRN: [id number]  HPI Patient presents with 3 days of low back pain secondary to Flexeril accident.  Patient was using a flex position for prolonged period time and when she stood up she felt a catch in her back.  Patient denies radicular component to her back pain.  Patient denies bladder or bowel dysfunction.  Patient described the pain as "achy/spasmatic".  Mild relief with over-the-counter anti-inflammatory medications.  Patient also requests refill on levothyroxine.   Review of Systems Hyperlipidemia, hypertension, and hypothyroidism.    Objective:   Physical Exam  No acute distress.  BP is 142/75, respiration 14, temperature 97.7, patient 97% O2 sat on room air.  Patient weighs 240 pounds and BMI is 35.44. No obvious lumbar spine deformity.  Mild guarding with palpation of the lumbar spinal processes 4 through S1.  Patient has full Nikkel range of motion with left paraspinal muscle spasms.  Patient had negative straight leg test in the supine position.      Assessment & Plan: Low back pain.   Patient given discharge care instruction.  Lidoderm patch applied prior to departure.  Patient given a prescription for naproxen and Norflex.  Patient advised follow-up in 2 days if no improvement.

## 2020-11-08 NOTE — Progress Notes (Signed)
Pt states lower back started hurting Saturday morning and by Sunday it radiated to both sides of lower back and as of today its steady on the right side. Pt has been using heating pads and taking Aleve and tylenol pm to get some relief. CL,Rma

## 2020-11-24 ENCOUNTER — Other Ambulatory Visit: Payer: Self-pay | Admitting: Nurse Practitioner

## 2020-12-21 ENCOUNTER — Other Ambulatory Visit: Payer: Self-pay

## 2020-12-21 ENCOUNTER — Ambulatory Visit: Payer: Self-pay

## 2020-12-21 VITALS — BP 136/73 | HR 90

## 2020-12-21 DIAGNOSIS — Z0130 Encounter for examination of blood pressure without abnormal findings: Secondary | ICD-10-CM

## 2020-12-22 ENCOUNTER — Other Ambulatory Visit: Payer: Self-pay | Admitting: Physician Assistant

## 2020-12-22 DIAGNOSIS — E7849 Other hyperlipidemia: Secondary | ICD-10-CM

## 2021-01-06 ENCOUNTER — Encounter: Payer: PRIVATE HEALTH INSURANCE | Admitting: Obstetrics and Gynecology

## 2021-01-12 ENCOUNTER — Other Ambulatory Visit: Payer: Self-pay | Admitting: Nurse Practitioner

## 2021-01-12 DIAGNOSIS — I10 Essential (primary) hypertension: Secondary | ICD-10-CM

## 2021-02-27 ENCOUNTER — Other Ambulatory Visit: Payer: Self-pay

## 2021-02-27 DIAGNOSIS — I10 Essential (primary) hypertension: Secondary | ICD-10-CM

## 2021-02-27 MED ORDER — HYDROCHLOROTHIAZIDE 25 MG PO TABS: 25.0000 mg | ORAL_TABLET | Freq: Every day | ORAL | 3 refills | Status: DC

## 2021-03-05 ENCOUNTER — Other Ambulatory Visit: Payer: Self-pay | Admitting: Nurse Practitioner

## 2021-03-05 DIAGNOSIS — I10 Essential (primary) hypertension: Secondary | ICD-10-CM

## 2021-03-06 ENCOUNTER — Other Ambulatory Visit: Payer: Self-pay | Admitting: Physician Assistant

## 2021-03-06 DIAGNOSIS — I10 Essential (primary) hypertension: Secondary | ICD-10-CM

## 2021-03-06 MED ORDER — HYDROCHLOROTHIAZIDE 25 MG PO TABS: 25.0000 mg | ORAL_TABLET | Freq: Every day | ORAL | 3 refills | Status: DC

## 2021-03-06 MED ORDER — HYDROCHLOROTHIAZIDE 25 MG PO TABS
25.0000 mg | ORAL_TABLET | Freq: Every day | ORAL | 3 refills | Status: DC
Start: 2021-03-06 — End: 2022-06-25

## 2021-04-26 ENCOUNTER — Other Ambulatory Visit: Payer: Self-pay

## 2021-04-26 ENCOUNTER — Encounter: Payer: Self-pay | Admitting: Physician Assistant

## 2021-04-26 ENCOUNTER — Ambulatory Visit: Payer: Self-pay | Admitting: Physician Assistant

## 2021-04-26 VITALS — Temp 99.7°F

## 2021-04-26 DIAGNOSIS — U071 COVID-19: Secondary | ICD-10-CM

## 2021-04-26 DIAGNOSIS — Z1152 Encounter for screening for COVID-19: Secondary | ICD-10-CM

## 2021-04-26 LAB — POC COVID19 BINAXNOW: SARS Coronavirus 2 Ag: POSITIVE — AB

## 2021-04-26 MED ORDER — NIRMATRELVIR/RITONAVIR (PAXLOVID)TABLET: 3.0000 | ORAL_TABLET | Freq: Two times a day (BID) | ORAL | 0 refills | Status: AC

## 2021-04-26 NOTE — Progress Notes (Signed)
° °  Subjective: Viral illness    Patient ID: Laura Walls, female    DOB: 22-Sep-1960, 61 y.o.   MRN: [id number]  HPI Patient states she woke up this morning with fever and sore throat.  Patient states developed body aches approxi-2 hours ago and came to clinic and tested positive for COVID-19.  Patient has taken 2 COVID-19 initial vaccine and 1 booster.  Last booster was greater than a year.  Denies recent travel.   Review of Systems Hypertension hyperlipidemia, hypertension, and hypothyroidism.    Objective:   Physical Exam  This is a virtual visit.      Assessment & Plan: COVID-19.

## 2021-04-26 NOTE — Progress Notes (Signed)
Slight fever, chills started at 2:30AM  Rapid is positive for covid.

## 2021-04-26 NOTE — Progress Notes (Signed)
Started having fever and chills and 2am this morning./CL,RMA

## 2021-05-12 ENCOUNTER — Encounter: Payer: PRIVATE HEALTH INSURANCE | Admitting: Obstetrics and Gynecology

## 2021-08-09 ENCOUNTER — Encounter: Payer: Self-pay | Admitting: Physician Assistant

## 2021-08-09 ENCOUNTER — Ambulatory Visit: Payer: Self-pay | Admitting: Physician Assistant

## 2021-08-09 DIAGNOSIS — R309 Painful micturition, unspecified: Secondary | ICD-10-CM

## 2021-08-09 LAB — POCT URINALYSIS DIPSTICK
Bilirubin, UA: NEGATIVE
Glucose, UA: NEGATIVE
Ketones, UA: NEGATIVE
Nitrite, UA: NEGATIVE
Protein, UA: NEGATIVE
Spec Grav, UA: 1.025 (ref 1.010–1.025)
Urobilinogen, UA: 0.2 E.U./dL
pH, UA: 6 (ref 5.0–8.0)

## 2021-08-09 MED ORDER — ATORVASTATIN CALCIUM 10 MG PO TABS: 10.0000 mg | ORAL_TABLET | Freq: Every day | ORAL | 3 refills | Status: DC

## 2021-08-09 MED ORDER — PHENAZOPYRIDINE HCL 200 MG PO TABS: 200.0000 mg | ORAL_TABLET | Freq: Three times a day (TID) | ORAL | 0 refills | Status: DC | PRN

## 2021-08-09 MED ORDER — NITROFURANTOIN MONOHYD MACRO 100 MG PO CAPS: 100.0000 mg | ORAL_CAPSULE | Freq: Two times a day (BID) | ORAL | 0 refills | Status: DC

## 2021-08-09 NOTE — Progress Notes (Signed)
? ?  Subjective: Dysuria  ? ? Patient ID: Laura Walls, female    DOB: March 28, 1961, 61 y.o.   MRN: [id number] ? ?HPI ?Patient complain of 1-1/2 weeks of dysuria, urinary frequency, and urinary urgency.  Denies flank pain, fever, or vaginal discharge. ? ?Review of Systems ? ?  Hypertension, hyperlipidemia, and hypothyroidism ?Objective:  ? Physical Exam ? ?See nurses note for vital signs. ?0 Result Notes ?          ?Component Ref Range & Units 13:48 ?(08/09/21) 11 mo ago ?(08/22/20) 1 yr ago ?(12/30/19) 2 yr ago ?(07/02/19) 2 yr ago ?(02/26/19) 2 yr ago ?(01/13/19) 3 yr ago ?(09/02/17)  ?Color, UA  yellow  Yellow  Yellow  yellow  Amber  Dk. Yellow  Yellow R   ?Clarity, UA  cloudy  Clear  Clear  clear  cloudy  Clear    ?Glucose, UA Negative Negative  Negative  Negative  Negative  Negative  Negative    ?Bilirubin, UA  negative  Negative  Negative  negative  neg  Negative  Negative R   ?Ketones, UA  negative  Negative  Negative  negative  neg  Negative  Negative R   ?Spec Grav, UA 1.010 - 1.025 1.025  1.015  1.020  1.015  1.025  1.020  1.020 R   ?Blood, UA  1+  Positive CM  Positive CM  positive CM  1+ CM  1+ CM  2+ Abnormal  R   ?pH, UA 5.0 - 8.0 6.0  6.0  6.0  6.0  6.0  6.0  7.0 R   ?Protein, UA Negative Negative  Negative  Negative  Negative  Negative  Negative  Negative R   ?Urobilinogen, UA 0.2 or 1.0 E.U./dL 0.2  0.2  0.2  0.2  0.2  0.2    ?Nitrite, UA  negative  Negative  Negative  negative  neg  Negative  Negative R   ?Leukocytes, UA Negative Large (3+) Abnormal   Negative  Negative  Negative  Negative  Negative  Trace Abnormal    ?Appearance               ?          ?  ? ? ?   ?Assessment & Plan: UTI  ? ?Discussed urinalysis results with patient.  Advise urine culture is pending.  Patient given a prescription for Macrobid and Pyridium.  Patient will follow-up status post 10 days antibiotic use. ?

## 2021-08-09 NOTE — Progress Notes (Signed)
Pt presents today for dysuria for about a week./CL,RMA  ?

## 2021-08-11 LAB — URINE CULTURE

## 2021-08-14 ENCOUNTER — Encounter: Payer: Self-pay | Admitting: Physician Assistant

## 2021-08-14 ENCOUNTER — Ambulatory Visit: Payer: Self-pay | Admitting: Physician Assistant

## 2021-08-14 VITALS — BP 157/79 | HR 80

## 2021-08-14 DIAGNOSIS — H8110 Benign paroxysmal vertigo, unspecified ear: Secondary | ICD-10-CM

## 2021-08-14 MED ORDER — MECLIZINE HCL 25 MG PO TABS: 25.0000 mg | ORAL_TABLET | Freq: Three times a day (TID) | ORAL | 0 refills | Status: DC | PRN

## 2021-08-14 NOTE — Progress Notes (Signed)
Pt woke up feeling dizzy and nauseated  ?

## 2021-08-14 NOTE — Progress Notes (Signed)
? ?  Subjective: Vertigo  ? ? Patient ID: Laura Walls, female    DOB: 25-Jan-1961, 61 y.o.   MRN: [id number] ? ?HPI ?Patient awakened with vertigo this morning.  Patient states the past 3 days there has been episodes of sinus congestion.  Patient describes vertigo as the room is spinning.  Patient states this is occurred with abrupt head movements and coming from a sitting to standing position.  Patient stated mild nausea associated with complaint but no vomiting. ? ? ?Review of Systems ?Hyperlipidemia, hypertension, and hypothyroidism. ?   ?Objective:  ? Physical Exam ?See nurses note for vital signs ?Mild distress.  Patient was tilt negative by blood pressure/pulse. ?HEENT unremarkable, but positive Romberg test. ?Lungs are clear to auscultation heart is regular rate and rhythm. ?   ? ?Assessment & Plan: Vertigo  ? ?Trial Antivert with first dose.  50 mg followed by 25 mg every 8 hours.  Patient will follow-up in 3 days. ?

## 2021-08-16 ENCOUNTER — Encounter: Payer: Self-pay | Admitting: Physician Assistant

## 2021-08-16 ENCOUNTER — Ambulatory Visit: Payer: Self-pay | Admitting: Physician Assistant

## 2021-08-16 VITALS — BP 140/71 | HR 88 | Temp 98.0°F | Resp 14

## 2021-08-16 DIAGNOSIS — H8110 Benign paroxysmal vertigo, unspecified ear: Secondary | ICD-10-CM

## 2021-08-16 NOTE — Progress Notes (Signed)
? ?  Subjective: Vertigo  ? ? Patient ID: Laura Walls, female    DOB: 1960-10-17, 61 y.o.   MRN: [id number] ? ?HPI ?Patient is followed for 2 days diagnosed with vertigo.  Patient was given a prescription for Antivert and states she is 80% better.  Patient denies any nausea at this time.  Patient states she is able to ambulate for any mild symptoms of dizziness. ? ?Review of Systems ?Hyperlipidemia, ?   ?Objective:  ? Physical Exam ? ?Temperature is 98, respiration 14, pulse 88, and BP is 140/71. ?Patient ambulates with a normal gait.  Patient sits and stands without signs of vertigo. ? ? ?   ?Assessment & Plan: Vertigo  ?Patient advised to continue Antivert for the next 3 days.  Patient may return back to work tomorrow.  Return back to clinic if condition worsens ? ?

## 2021-08-16 NOTE — Progress Notes (Signed)
Pt presents today for f/u on dizziness and nausea. Pt states she still a little bit dizzy but the nausea has went away.  ?

## 2021-08-26 ENCOUNTER — Encounter: Payer: Self-pay | Admitting: Emergency Medicine

## 2021-08-26 ENCOUNTER — Other Ambulatory Visit: Payer: Self-pay

## 2021-08-26 ENCOUNTER — Emergency Department: Payer: 59

## 2021-08-26 ENCOUNTER — Emergency Department
Admission: EM | Admit: 2021-08-26 | Discharge: 2021-08-26 | Disposition: A | Discharge status: Home / Self Care | Payer: 59 | Attending: Student in an Organized Health Care Education/Training Program | Admitting: Student in an Organized Health Care Education/Training Program

## 2021-08-26 DIAGNOSIS — Z20822 Contact with and (suspected) exposure to covid-19: Secondary | ICD-10-CM | POA: Insufficient documentation

## 2021-08-26 DIAGNOSIS — R051 Acute cough: Secondary | ICD-10-CM | POA: Insufficient documentation

## 2021-08-26 DIAGNOSIS — I10 Essential (primary) hypertension: Secondary | ICD-10-CM | POA: Insufficient documentation

## 2021-08-26 DIAGNOSIS — R0602 Shortness of breath: Secondary | ICD-10-CM | POA: Insufficient documentation

## 2021-08-26 DIAGNOSIS — R079 Chest pain, unspecified: Secondary | ICD-10-CM | POA: Insufficient documentation

## 2021-08-26 LAB — TROPONIN I (HIGH SENSITIVITY)
Troponin I (High Sensitivity): 4 ng/L (ref <18)
Troponin I (High Sensitivity): 4 ng/L (ref <18)

## 2021-08-26 LAB — CBC
HCT: 35.2 % — ABNORMAL LOW (ref 36.0–46.0)
Hemoglobin: 11.5 g/dL — ABNORMAL LOW (ref 12.0–15.0)
MCH: 26.6 pg (ref 26.0–34.0)
MCHC: 32.7 g/dL (ref 30.0–36.0)
MCV: 81.5 fL (ref 80.0–100.0)
Platelets: 437 10*3/uL — ABNORMAL HIGH (ref 150–400)
RBC: 4.32 MIL/uL (ref 3.87–5.11)
RDW: 13.4 % (ref 11.5–15.5)
WBC: 10.1 10*3/uL (ref 4.0–10.5)
nRBC: 0 % (ref 0.0–0.2)

## 2021-08-26 LAB — BASIC METABOLIC PANEL
Anion gap: 8 (ref 5–15)
BUN: 14 mg/dL (ref 8–23)
CO2: 30 mmol/L (ref 22–32)
Calcium: 9.1 mg/dL (ref 8.9–10.3)
Chloride: 104 mmol/L (ref 98–111)
Creatinine, Ser: 0.93 mg/dL (ref 0.44–1.00)
GFR, Estimated: >60 mL/min (ref >60)
Glucose, Bld: 93 mg/dL (ref 70–99)
Potassium: 3.2 mmol/L — ABNORMAL LOW (ref 3.5–5.1)
Sodium: 142 mmol/L (ref 135–145)

## 2021-08-26 LAB — D-DIMER, QUANTITATIVE: D-Dimer, Quant: 0.55 ug/mL-FEU — ABNORMAL HIGH (ref 0.00–0.50)

## 2021-08-26 LAB — RESP PANEL BY RT-PCR (FLU A&B, COVID) ARPGX2
Influenza A by PCR: NEGATIVE
Influenza B by PCR: NEGATIVE
SARS Coronavirus 2 by RT PCR: NEGATIVE

## 2021-08-26 MED ORDER — IPRATROPIUM-ALBUTEROL 0.5-2.5 (3) MG/3ML IN SOLN
3.0000 mL | Freq: Once | RESPIRATORY_TRACT | Status: AC
  Administered 2021-08-26: 3 mL via RESPIRATORY_TRACT
  Filled 2021-08-26: qty 3

## 2021-08-26 MED ORDER — AZITHROMYCIN 250 MG PO TABS: ORAL_TABLET | ORAL | 0 refills | Status: AC

## 2021-08-26 MED ORDER — PREDNISONE 20 MG PO TABS: 40.0000 mg | ORAL_TABLET | Freq: Every day | ORAL | 0 refills | Status: AC

## 2021-08-26 MED ORDER — PREDNISONE 20 MG PO TABS
60.0000 mg | ORAL_TABLET | Freq: Once | ORAL | Status: AC
  Administered 2021-08-26: 60 mg via ORAL
  Filled 2021-08-26: qty 3

## 2021-08-26 MED ORDER — METOCLOPRAMIDE HCL 5 MG/ML IJ SOLN
10.0000 mg | Freq: Once | INTRAMUSCULAR | Status: DC
  Filled 2021-08-26: qty 2

## 2021-08-26 MED ORDER — ALBUTEROL SULFATE HFA 108 (90 BASE) MCG/ACT IN AERS: 2.0000 | INHALATION_SPRAY | Freq: Four times a day (QID) | RESPIRATORY_TRACT | 2 refills | Status: DC | PRN

## 2021-08-26 MED ORDER — IOHEXOL 350 MG/ML SOLN
80.0000 mL | Freq: Once | INTRAVENOUS | Status: AC | PRN
  Administered 2021-08-26: 75 mL via INTRAVENOUS
  Filled 2021-08-26: qty 80

## 2021-08-26 NOTE — ED Provider Notes (Signed)
----------------------------------------- ?  10:50 PM on 08/26/2021 ?----------------------------------------- ? ?Blood pressure (!) 145/79, pulse 87, temperature 99.5 ?F (37.5 ?C), temperature source Oral, resp. rate 20, height 5\' 9"  (1.753 m), weight 110 kg, SpO2 96 %. ? ?Assuming care from Dr. , PA-C/NP-C.  In short, Laura Walls is a 61 y.o. female with a chief complaint of Chest Pain and Shortness of Breath ?68  Refer to the original H&P for additional details. ? ?The current plan of care is to await D-dimer and troponin, and disposition the patient accordingly. ? ?______________________________________________ ?  ?RADIOLOGY ? ?CT Angio Chest PE w/-w/o CM ? ?IMPRESSION: ?No acute intrathoracic pathology. No CT evidence of pulmonary ?embolism ?____________________________________________ ? ?PROCEDURES ? ?Procedures ?____________________________________________ ? ?INITIAL IMPRESSION / ASSESSMENT AND PLAN / ED COURSE ? ?Differential includes, but is not limited to, viral syndrome, bronchitis including COPD exacerbation, pneumonia, reactive airway disease including asthma, CHF including exacerbation with or without pulmonary/interstitial edema, pneumothorax, ACS, thoracic trauma, and pulmonary embolism. ? ? ?Patient to the ED for evaluation of cough and shortness of breath.  She is evaluated for complaints in the ED, and found to have a work-up concerning for possible intrathoracic etiology.  Initial chest x-ray was negative.  Patient was further evaluated with a D-dimer which did return elevated at 0.55.  Patient was found to have a negative chest PE CT scan.  She is reassured by her negative work-up.  She also notes improvement of her symptoms after second DuoNeb treatment.  Patient stable at this time for discharge home with prescriptions for azithromycin, prednisone, and albuterol inhaler.  Return precautions have been reviewed. ? ?Laura Walls was evaluated in Emergency Department on 08/26/2021  for the symptoms described in the history of present illness. She was evaluated in the context of the global COVID-19 pandemic, which necessitated consideration that the patient might be at risk for infection with the SARS-CoV-2 virus that causes COVID-19. Institutional protocols and algorithms that pertain to the evaluation of patients at risk for COVID-19 are in a state of rapid change based on information released by regulatory bodies including the CDC and federal and state organizations. These policies and algorithms were followed during the patient's care in the ED. ?____________________________________________ ? ?FINAL CLINICAL IMPRESSION(S) / ED DIAGNOSES ? ?Final diagnoses:  ?Acute cough  ?Shortness of breath  ?. ? ?  ?10/26/2021, PA-C ?08/26/21 2253 ? ?  ?2254, MD ?08/27/21 1223 ? ?

## 2021-08-26 NOTE — Discharge Instructions (Addendum)
You are being treated for bronchitis.  There is no evidence of pneumonia or a blood clot in the lungs on the CT scan.  Take the antibiotic as directed and steroid as prescribed.  Use albuterol inhaler as needed for shortness of breath.  Follow-up with primary provider or return to the ED if needed. ?

## 2021-08-26 NOTE — ED Notes (Signed)
Patient states she feels better after first Duoneb treatment. ?

## 2021-08-26 NOTE — ED Triage Notes (Signed)
Pt reports cp to mid chest heavy in nature since Monday. Pt reports that the pain is constant but intermittently gets worse. Pt reports also gets worse when she lays on her back at night. Pt reports some SOB as well.  ?

## 2021-08-26 NOTE — ED Provider Notes (Signed)
? ?[facility] ?Provider Note ? ? ? Event Date/Time  ? First MD Initiated Contact with Patient 08/26/21 1910   ?  (approximate) ? ? ?History  ? ?Chest Pain and Shortness of Breath ? ? ?HPI ? ?Laura Walls is a 61 y.o. female   with a history of hypertension as well as thyroid disease presents to the ER for evaluation of shortness of breath cough chest pain for the past day or 2.  Denies any history of PE or blood clots has noted some swelling in her legs.  Never been diagnosed with bronchitis or asthma she does not smoke.  Has had some productive cough but no fevers or chills.  No nausea vomiting or diarrhea. ? ?  ? ? ?Physical Exam  ? ?Triage Vital Signs: ?ED Triage Vitals  ?Enc Vitals Group  ?   BP 08/26/21 1700 137/81  ?   Pulse Rate 08/26/21 1700 (!) 103  ?   Resp 08/26/21 1700 20  ?   Temp 08/26/21 1700 99.5 ?F (37.5 ?C)  ?   Temp Source 08/26/21 1700 Oral  ?   SpO2 08/26/21 1700 96 %  ?   Weight 08/26/21 1650 242 lb 8.1 oz (110 kg)  ?   Height 08/26/21 1650 5\' 9"  (1.753 m)  ?   Head Circumference --   ?   Peak Flow --   ?   Pain Score 08/26/21 1650 5  ?   Pain Loc --   ?   Pain Edu? --   ?   Excl. in [place]? --   ? ? ?Most recent vital signs: ?Vitals:  ? 08/26/21 1700 08/26/21 1928  ?BP: 137/81 (!) 145/79  ?Pulse: (!) 103 87  ?Resp: 20 20  ?Temp: 99.5 ?F (37.5 ?C)   ?SpO2: 96% 96%  ? ? ? ?Constitutional: Alert  ?Eyes: Conjunctivae are normal.  ?Head: Atraumatic. ?Nose: No congestion/rhinnorhea. ?Mouth/Throat: Mucous membranes are moist.   ?Neck: Painless ROM.  ?Cardiovascular:   Good peripheral circulation.  Mildly tachycardic but well perfused ?Respiratory: Normal respiratory effort.  No retractions.  Frequent bronchitis cough ?Gastrointestinal: Soft and nontender.  ?Musculoskeletal:  no deformity, trace bilateral LE ?Neurologic:  MAE spontaneously. No gross focal neurologic deficits are appreciated.  ?Skin:  Skin is warm, dry and intact. No rash noted. ?Psychiatric: Mood and affect are  normal. Speech and behavior are normal. ? ? ? ?ED Results / Procedures / Treatments  ? ?Labs ?(all labs ordered are listed, but only abnormal results are displayed) ?Labs Reviewed  ?BASIC METABOLIC PANEL - Abnormal; Notable for the following components:  ?    Result Value  ? Potassium 3.2 (*)   ? All other components within normal limits  ?CBC - Abnormal; Notable for the following components:  ? Hemoglobin 11.5 (*)   ? HCT 35.2 (*)   ? Platelets 437 (*)   ? All other components within normal limits  ?RESP PANEL BY RT-PCR (FLU A&B, COVID) [id number]  ?D-DIMER, QUANTITATIVE  ?TROPONIN I (HIGH SENSITIVITY)  ?TROPONIN I (HIGH SENSITIVITY)  ? ? ? ?EKG ? ?ED ECG REPORT ?I, Merlyn Lot, the attending physician, personally viewed and interpreted this ECG. ? ? Date: 08/26/2021 ? EKG Time: 16:57 ? Rate: 105 ? Rhythm: normal ? Axis: left ? Intervals:normal ? ST&T Change: no stemi, no depressions ? ? ? ?RADIOLOGY ?Please see ED Course for my review and interpretation. ? ?I personally reviewed all radiographic images ordered to evaluate for the above acute complaints and  reviewed radiology reports and findings.  These findings were personally discussed with the patient.  Please see medical record for radiology report. ? ? ? ?PROCEDURES: ? ?Critical Care performed: No ? ?Procedures ? ? ?MEDICATIONS ORDERED IN ED: ?Medications  ?predniSONE (DELTASONE) tablet 60 mg (has no administration in time range)  ?ipratropium-albuterol (DUONEB) 0.5-2.5 (3) MG/3ML nebulizer solution 3 mL (has no administration in time range)  ?ipratropium-albuterol (DUONEB) 0.5-2.5 (3) MG/3ML nebulizer solution 3 mL (3 mLs Nebulization Given 08/26/21 1936)  ? ? ? ?IMPRESSION / MDM / ASSESSMENT AND PLAN / ED COURSE  ?I reviewed the triage vital signs and the nursing notes. ?             ?               ? ?Differential diagnosis includes, but is not limited to, Asthma, copd, CHF, pna, ptx, malignancy, Pe, anemia ? ?Patient nontoxic-appearing presented to ER with  symptoms as described above.  May have possible bronchitis or viral illness.  Chest x-ray without any evidence of infiltrate.  On exam I do not appreciate significant wheezing but has had very bronchitic sounding cough.  No sign of congestive heart failure.  She is mildly tachycardic will order D-dimer to further restratify is otherwise have a lower suspicion for PE.  We will give nebulizer treatment. ? ? ?Clinical Course as of 08/26/21 1957  ?Sat Aug 26, 2021  ?1954 Patient did feel significantly improved after nebulizer treatment have a high suspicion this is bronchitis.  Patient will be signed out to oncoming physician pending follow-up D-dimer repeat troponin if negative do feel that she will be appropriate for discharge. [PR]  ?  ?Clinical Course User Index ?[PR] Merlyn Lot, MD  ? ? ? ?FINAL CLINICAL IMPRESSION(S) / ED DIAGNOSES  ? ?Final diagnoses:  ?Acute cough  ?Shortness of breath  ? ? ? ?Rx / DC Orders  ? ?ED Discharge Orders   ? ?      Ordered  ?  albuterol (VENTOLIN HFA) 108 (90 Base) MCG/ACT inhaler  Every 6 hours PRN       ? 08/26/21 1955  ?  predniSONE (DELTASONE) 20 MG tablet  Daily       ? 08/26/21 1955  ?  azithromycin (ZITHROMAX Z-PAK) 250 MG tablet       ? 08/26/21 1955  ? ?  ?  ? ?  ? ? ? ?Note:  This document was prepared using Dragon voice recognition software and may include unintentional dictation errors. ? ?  ?Merlyn Lot, MD ?08/26/21 1957 ? ?

## 2021-08-30 ENCOUNTER — Ambulatory Visit: Payer: Self-pay

## 2021-08-30 DIAGNOSIS — Z0000 Encounter for general adult medical examination without abnormal findings: Secondary | ICD-10-CM

## 2021-08-30 LAB — POCT URINALYSIS DIPSTICK
Bilirubin, UA: NEGATIVE
Blood, UA: POSITIVE
Glucose, UA: NEGATIVE
Ketones, UA: NEGATIVE
Leukocytes, UA: NEGATIVE
Nitrite, UA: NEGATIVE
Protein, UA: NEGATIVE
Spec Grav, UA: 1.025 (ref 1.010–1.025)
Urobilinogen, UA: 0.2 E.U./dL
pH, UA: 6 (ref 5.0–8.0)

## 2021-08-31 LAB — CMP12+LP+TP+TSH+6AC+CBC/D/PLT
ALT: 18 IU/L (ref 0–32)
AST: 16 IU/L (ref 0–40)
Albumin/Globulin Ratio: 1.3 (ref 1.2–2.2)
Albumin: 4.1 g/dL (ref 3.8–4.8)
Alkaline Phosphatase: 96 IU/L (ref 44–121)
BUN/Creatinine Ratio: 15 (ref 12–28)
BUN: 14 mg/dL (ref 8–27)
Basophils Absolute: 0 10*3/uL (ref 0.0–0.2)
Basos: 0 %
Bilirubin Total: 0.3 mg/dL (ref 0.0–1.2)
Calcium: 9.7 mg/dL (ref 8.7–10.3)
Chloride: 98 mmol/L (ref 96–106)
Chol/HDL Ratio: 3.3 ratio (ref 0.0–4.4)
Cholesterol, Total: 170 mg/dL (ref 100–199)
Creatinine, Ser: 0.94 mg/dL (ref 0.57–1.00)
EOS (ABSOLUTE): 0 10*3/uL (ref 0.0–0.4)
Eos: 0 %
Estimated CHD Risk: <0.5 times avg. (ref 0.0–1.0)
Free Thyroxine Index: 2.8 (ref 1.2–4.9)
GGT: 31 IU/L (ref 0–60)
Globulin, Total: 3.2 g/dL (ref 1.5–4.5)
Glucose: 149 mg/dL — ABNORMAL HIGH (ref 70–99)
HDL: 52 mg/dL (ref >39)
Hematocrit: 38.6 % (ref 34.0–46.6)
Hemoglobin: 12.5 g/dL (ref 11.1–15.9)
Immature Grans (Abs): 0.1 10*3/uL (ref 0.0–0.1)
Immature Granulocytes: 1 %
Iron: 68 ug/dL (ref 27–139)
LDH: 149 IU/L (ref 119–226)
LDL Chol Calc (NIH): 100 mg/dL — ABNORMAL HIGH (ref 0–99)
Lymphocytes Absolute: 2.3 10*3/uL (ref 0.7–3.1)
Lymphs: 29 %
MCH: 26.9 pg (ref 26.6–33.0)
MCHC: 32.4 g/dL (ref 31.5–35.7)
MCV: 83 fL (ref 79–97)
Monocytes Absolute: 0.1 10*3/uL (ref 0.1–0.9)
Monocytes: 1 %
Neutrophils Absolute: 5.5 10*3/uL (ref 1.4–7.0)
Neutrophils: 69 %
Phosphorus: 4.8 mg/dL — ABNORMAL HIGH (ref 3.0–4.3)
Platelets: 431 10*3/uL (ref 150–450)
Potassium: 3.5 mmol/L (ref 3.5–5.2)
RBC: 4.65 x10E6/uL (ref 3.77–5.28)
RDW: 13.8 % (ref 11.7–15.4)
Sodium: 139 mmol/L (ref 134–144)
T3 Uptake Ratio: 29 % (ref 24–39)
T4, Total: 9.5 ug/dL (ref 4.5–12.0)
TSH: 0.855 u[IU]/mL (ref 0.450–4.500)
Total Protein: 7.3 g/dL (ref 6.0–8.5)
Triglycerides: 98 mg/dL (ref 0–149)
Uric Acid: 6.9 mg/dL (ref 3.0–7.2)
VLDL Cholesterol Cal: 18 mg/dL (ref 5–40)
WBC: 8 10*3/uL (ref 3.4–10.8)
eGFR: 69 mL/min/{1.73_m2} (ref >59)

## 2021-09-01 LAB — HGB A1C W/O EAG: Hgb A1c MFr Bld: 5 % (ref 4.8–5.6)

## 2021-09-01 LAB — SPECIMEN STATUS REPORT

## 2021-09-06 ENCOUNTER — Ambulatory Visit: Payer: Self-pay | Admitting: Physician Assistant

## 2021-09-06 VITALS — BP 131/71 | HR 87 | Temp 98.4°F | Resp 14 | Ht 68.0 in | Wt 242.0 lb

## 2021-09-06 DIAGNOSIS — Z0000 Encounter for general adult medical examination without abnormal findings: Secondary | ICD-10-CM

## 2021-09-06 DIAGNOSIS — R600 Localized edema: Secondary | ICD-10-CM

## 2021-09-06 MED ORDER — FUROSEMIDE 40 MG PO TABS: 40.0000 mg | ORAL_TABLET | Freq: Every day | ORAL | 0 refills | Status: DC

## 2021-09-06 NOTE — Progress Notes (Signed)
Pt presents today to complete physical, Pt has edema in left foot and ankle from "salt" ?

## 2021-09-06 NOTE — Progress Notes (Signed)
? ?[facility]  occup[facility] clinic ? ?____________________________________________ ? ? None  ?  (approximate) ? ?I have reviewed the triage vital signs and the nursing notes. ? ? ?HISTORY ? ?Chief Complaint ?No chief complaint on file. ? ? ?HPI ?Laura Walls is a 61 y.o. female patient presents annual physical exam.  Patient was concerned for bilateral ankle edema.  Patient admits to high sodium diet.  Patient denies dyspnea or chest pain. ?   ? ?  ? ? ?Past Medical History:  ?Diagnosis Date  ? Carpal tunnel syndrome   ? both wrist  ? Hormone disorder   ? Hypertension   ? Thyroid disease   ? ? ?Patient Active Problem List  ? Diagnosis Date Noted  ? Vitamin D deficiency 01/13/2020  ? Hyperlipidemia 01/22/2019  ? Hypothyroid 01/22/2019  ? Breast cancer screening by mammogram 01/22/2019  ? Essential hypertension 01/22/2019  ? Acute pain of left knee 01/22/2019  ? Hematuria 01/22/2019  ? Class 2 severe obesity due to excess calories with serious comorbidity and body mass index (BMI) of 36.0 to 36.9 in adult [facility]) 01/22/2019  ? Lower extremity edema 01/22/2019  ? Family history of colon cancer in mother 01/22/2019  ? ? ?Past Surgical History:  ?Procedure Laterality Date  ? ABDOMINAL HYSTERECTOMY    ? APPENDECTOMY    ? CARPAL TUNNEL RELEASE    ? CESAREAN SECTION    ? HEEL SPUR EXCISION    ? ? ?Prior to Admission medications   ?Medication Sig Start Date End Date Taking? Authorizing Provider  ?albuterol (VENTOLIN HFA) 108 (90 Base) MCG/ACT inhaler Inhale 2 puffs into the lungs every 6 (six) hours as needed for wheezing or shortness of breath. 08/26/21  Yes Willy Eddy, MD  ?atorvastatin (LIPITOR) 10 MG tablet Take 1 tablet (10 mg total) by mouth daily. 08/09/21  Yes Joni Reining, PA-C  ?hydrochlorothiazide (HYDRODIURIL) 25 MG tablet Take 1 tablet (25 mg total) by mouth daily. 03/06/21  Yes Joni Reining, PA-C  ?levothyroxine (SYNTHROID) 125 MCG tablet Take 1 tablet (125 mcg total) by mouth daily.  11/08/20  Yes Joni Reining, PA-C  ?meclizine (ANTIVERT) 25 MG tablet Take 1 tablet (25 mg total) by mouth 3 (three) times daily as needed for dizziness. 08/14/21  Yes Joni Reining, PA-C  ? ? ?Allergies ?Patient has no known allergies. ? ?Family History  ?Problem Relation Age of Onset  ? Colon cancer Mother   ? Leukemia Father   ? Breast cancer Neg Hx   ? Bladder Cancer Neg Hx   ? Kidney cancer Neg Hx   ? ? ?Social History ?Social History  ? ?Tobacco Use  ? Smoking status: Never  ? Smokeless tobacco: Never  ?Vaping Use  ? Vaping Use: Never used  ?Substance Use Topics  ? Alcohol use: Not Currently  ? Drug use: Never  ? ? ?Review of Systems ?Constitutional: No fever/chills ?Eyes: No visual changes. ?ENT: No sore throat. ?Cardiovascular: Denies chest pain.  Bilateral lower extremity peripheral edema ?Respiratory: Denies shortness of breath. ?Gastrointestinal: No abdominal pain.  No nausea, no vomiting.  No diarrhea.  No constipation. ?Genitourinary: Negative for dysuria. ?Musculoskeletal: Negative for back pain. ?Skin: Negative for rash. ?Neurological: Negative for headaches, focal weakness or numbness. ?Endocrine: Hyperlipidemia, hypertension, and hypothyroidism. ? ?____________________________________________ ? ? ?PHYSICAL EXAM: ? ?VITAL SIGNS: BP is 131/71, pulse 87, respiration 14, temperature 98.4, and patient 97% O2 sat on room air.  Patient weighs 242 pounds and BMI is 36.80. ?Constitutional: Alert  and oriented. Well appearing and in no acute distress. ?Eyes: Conjunctivae are normal. PERRL. EOMI. ?Head: Atraumatic. ?Nose: No congestion/rhinnorhea. ?Mouth/Throat: Mucous membranes are moist.  Oropharynx non-erythematous. ?Neck: No stridor.  No cervical spine tenderness to palpation. ?Hematological/Lymphatic/Immunilogical: No cervical lymphadenopathy. ?Cardiovascular: Normal rate, regular rhythm. Grossly normal heart sounds.  Good peripheral circulation.  Bilateral ankle edema measuring 27.5 cm in  circumference. ?Respiratory: Normal respiratory effort.  No retractions. Lungs CTAB. ?Gastrointestinal: Soft and nontender. No distention. No abdominal bruits. No CVA tenderness. ?Genitourinary: Deferred ?Musculoskeletal: No lower extremity tenderness nor edema.  No joint effusions. ?Neurologic:  Normal speech and language. No gross focal neurologic deficits are appreciated. No gait instability. ?Skin:  Skin is warm, dry and intact. No rash noted. ?Psychiatric: Mood and affect are normal. Speech and behavior are normal. ? ?____________________________________________ ?  ?LABS ?(all labs ordered are listed, but only abnormal results are displayed) ? ?@EDLABS @ ?____________________________________________ ? ?EKG ? ?Sinus  Rhythm  ?WITHIN NORMAL LIMITS ? ?____________________________________________ ? ? ? ?____________________________________________ ? ? ?INITIAL IMPRESSION / ASSESSMENT AND PLAN ?Discussed lab results and EKG findings with patient.  Patient is amenable to discontinue HCTZ for 10 days and start Lasix at 40 mg for 10 days.  Patient will follow-up in 10 days for reevaluation and a consult to dietitian for sodium restriction will be generated. ?As part of my medical decision making, I reviewed the following data within the electronic MEDICAL RECORD NUMBER Notes from prior ED visits and [place] Controlled Substance Database ? ?   ? ?  ? ? ? ? ? ?____________________________________________ ? ? ?FINAL CLINICAL IMPRESSION ? ?Well exam ? ? ?ED Discharge Orders   ? ? None  ? ?  ? ? ? ?Note:  This document was prepared using Dragon voice recognition software and may include unintentional dictation errors. ? ?

## 2021-10-11 ENCOUNTER — Encounter: Payer: Self-pay | Admitting: Physician Assistant

## 2021-10-11 ENCOUNTER — Ambulatory Visit: Payer: Self-pay | Admitting: Physician Assistant

## 2021-10-11 ENCOUNTER — Ambulatory Visit: Payer: 59 | Admitting: Physician Assistant

## 2021-10-11 DIAGNOSIS — R609 Edema, unspecified: Secondary | ICD-10-CM

## 2021-10-11 NOTE — Progress Notes (Signed)
Pt presents today to follow up on left ankle swelling and it has not gone down any.

## 2021-10-11 NOTE — Addendum Note (Signed)
Addended by: Gardner Candle on: 10/11/2021 02:09 PM   Modules accepted: Orders

## 2021-10-11 NOTE — Progress Notes (Signed)
   Subjective: Bilateral peripheral edema lower extremity    Patient ID: Laura Walls, female    DOB: 1961-01-22, 61 y.o.   MRN: [id number]  HPI Patient follow-up for bilateral ankle edema.  Patient denies dyspnea or chest pain.  Patient was seen a month ago for this complaint and placed on his diuretics with no improvement.  Patient would not consider compression stocking until evaluation by vascular clinic.   Review of Systems Hypertension, hyperlipidemia, and hypertension.    Objective:   Physical Exam See nurses note for vital signs. Nonpitting bilateral ankle edema.  Ankle measurement is 25 cm in circumference bilaterally.  No real change from measures at last visit.       Assessment & Plan: Peripheral edema  We will consult to vascular clinic for definitive evaluation and treatment.  Patient again advised to purchase over-the-counter support stockings until evaluation by vascular clinic.

## 2021-11-23 ENCOUNTER — Encounter (INDEPENDENT_AMBULATORY_CARE_PROVIDER_SITE_OTHER): Payer: Self-pay | Admitting: Vascular Surgery

## 2021-11-23 ENCOUNTER — Ambulatory Visit (INDEPENDENT_AMBULATORY_CARE_PROVIDER_SITE_OTHER): Payer: 59 | Admitting: Vascular Surgery

## 2021-11-23 VITALS — BP 127/78 | HR 90 | Resp 17 | Ht 68.0 in | Wt 242.8 lb

## 2021-11-23 DIAGNOSIS — I890 Lymphedema, not elsewhere classified: Secondary | ICD-10-CM | POA: Diagnosis not present

## 2021-11-23 DIAGNOSIS — I10 Essential (primary) hypertension: Secondary | ICD-10-CM | POA: Diagnosis not present

## 2021-11-23 DIAGNOSIS — E039 Hypothyroidism, unspecified: Secondary | ICD-10-CM

## 2021-11-23 DIAGNOSIS — E7849 Other hyperlipidemia: Secondary | ICD-10-CM

## 2021-11-24 ENCOUNTER — Encounter (INDEPENDENT_AMBULATORY_CARE_PROVIDER_SITE_OTHER): Payer: Self-pay | Admitting: Vascular Surgery

## 2021-11-24 DIAGNOSIS — I890 Lymphedema, not elsewhere classified: Secondary | ICD-10-CM | POA: Insufficient documentation

## 2021-11-24 NOTE — Progress Notes (Signed)
MRN : [id number]  Laura Walls is a 61 y.o. (03-19-1961) female who presents with chief complaint of legs swell.  History of Present Illness:   Patient is seen for evaluation of leg swelling. The patient first noticed maybe some mild swelling remotely but is now concerned because of a significant increase in the overall edema over the past 6 months. The swelling isn't associated with severe pain but is uncomfortable by the end of the day.  She notes it's getting hard to find shoes that fit comfortably.  There has been an increasing amount of  discoloration noted by the patient. The patient notes that in the morning the legs are improved but they steadily worsened throughout the course of the day. Elevation seems to make the swelling of the legs better, dependency makes them much worse.   There is no history of ulcerations associated with the swelling.   The patient denies any recent changes in their medications.  The patient has not been wearing graduated compression.  The patient has no had any past angiography, interventions or vascular surgery.  The patient denies a history of DVT or PE. There is no prior history of phlebitis. There is no history of primary lymphedema.  There is no history of radiation treatment to the groin or pelvis No history of malignancies. No history of trauma or groin or pelvic surgery. No history of foreign travel or parasitic infections area    Current Meds  Medication Sig   atorvastatin (LIPITOR) 10 MG tablet Take 1 tablet (10 mg total) by mouth daily.   hydrochlorothiazide (HYDRODIURIL) 25 MG tablet Take 1 tablet (25 mg total) by mouth daily.   levothyroxine (SYNTHROID) 125 MCG tablet Take 1 tablet (125 mcg total) by mouth daily.    Past Medical History:  Diagnosis Date   Carpal tunnel syndrome    both wrist   Hormone disorder    Hypertension    Thyroid disease     Past Surgical History:  Procedure Laterality Date   ABDOMINAL  HYSTERECTOMY     APPENDECTOMY     CARPAL TUNNEL RELEASE     CESAREAN SECTION     HEEL SPUR EXCISION      Social History Social History   Tobacco Use   Smoking status: Never   Smokeless tobacco: Never  Vaping Use   Vaping Use: Never used  Substance Use Topics   Alcohol use: Not Currently   Drug use: Never    Family History Family History  Problem Relation Age of Onset   Colon cancer Mother    Leukemia Father    Breast cancer Neg Hx    Bladder Cancer Neg Hx    Kidney cancer Neg Hx     No Known Allergies   REVIEW OF SYSTEMS (Negative unless checked)  Constitutional: [] Weight loss  [] Fever  [] Chills Cardiac: [] Chest pain   [] Chest pressure   [] Palpitations   [] Shortness of breath when laying flat   [] Shortness of breath with exertion. Vascular:  [] Pain in legs with walking   [x] Pain in legs with standing  [] History of DVT   [] Phlebitis   [x] Swelling in legs   [] Varicose veins   [] Non-healing ulcers Pulmonary:   [] Uses home oxygen   [] Productive cough   [] Hemoptysis   [] Wheeze  [] COPD   [] Asthma Neurologic:  [] Dizziness   [] Seizures   [] History of stroke   [] History of TIA  [] Aphasia   [] Vissual changes   [] Weakness or numbness in arm   []   Weakness or numbness in leg Musculoskeletal:   [] Joint swelling   [] Joint pain   [] Low back pain Hematologic:  [] Easy bruising  [] Easy bleeding   [] Hypercoagulable state   [] Anemic Gastrointestinal:  [] Diarrhea   [] Vomiting  [] Gastroesophageal reflux/heartburn   [] Difficulty swallowing. Genitourinary:  [] Chronic kidney disease   [] Difficult urination  [] Frequent urination   [] Blood in urine Skin:  [] Rashes   [] Ulcers  Psychological:  [] History of anxiety   []  History of major depression.  Physical Examination  Vitals:   11/23/21 1520  BP: 127/78  Pulse: 90  Resp: 17  Weight: 242 lb 12.8 oz (110.1 kg)  Height: 5\' 8"  (1.727 m)   Body mass index is 36.92 kg/m. Gen: WD/WN, NAD Head: [place]/AT, No temporalis wasting.  Ear/Nose/Throat:  Hearing grossly intact, nares w/o erythema or drainage, pinna without lesions Eyes: PER, EOMI, sclera nonicteric.  Neck: Supple, no gross masses.  No JVD.  Pulmonary:  Good air movement, no audible wheezing, no use of accessory muscles.  Cardiac: RRR, precordium not hyperdynamic. Vascular:  no varicosities present bilaterally.  No significant venous stasis changes to the legs bilaterally.  3-4+ soft pitting edema  Vessel Right Left  Radial Palpable Palpable  Gastrointestinal: soft, non-distended. No guarding/no peritoneal signs.  Musculoskeletal: M/S 5/5 throughout.  No deformity.  Neurologic: CN 2-12 intact. Pain and light touch intact in extremities.  Symmetrical.  Speech is fluent. Motor exam as listed above. Psychiatric: Judgment intact, Mood & affect appropriate for pt's clinical situation. Dermatologic: Venous rashes no ulcers noted.  No changes consistent with cellulitis. Lymph : No lichenification or skin changes of chronic lymphedema.  CBC Lab Results  Component Value Date   WBC 8.0 08/30/2021   HGB 12.5 08/30/2021   HCT 38.6 08/30/2021   MCV 83 08/30/2021   PLT 431 08/30/2021    BMET    Component Value Date/Time   NA 139 08/30/2021 0858   K 3.5 08/30/2021 0858   CL 98 08/30/2021 0858   CO2 30 08/26/2021 1711   GLUCOSE 149 (H) 08/30/2021 0858   GLUCOSE 93 08/26/2021 1711   BUN 14 08/30/2021 0858   CREATININE 0.94 08/30/2021 0858   CALCIUM 9.7 08/30/2021 0858   GFRNONAA >60 08/26/2021 1711   GFRAA 77 12/30/2019 0859   CrCl cannot be calculated (Patient's most recent lab result is older than the maximum 21 days allowed.).  COAG No results found for: "INR", "PROTIME"  Radiology No results found.   Assessment/Plan 1. Lymphedema Recommend:  I have had a long discussion with the patient regarding swelling and why it  causes symptoms.  Patient will begin wearing graduated compression on a daily basis a prescription was given. The patient will  wear the stockings  first thing in the morning and removing them in the evening. The patient is instructed specifically not to sleep in the stockings.   In addition, behavioral modification will be initiated.  This will include frequent elevation, use of over the counter pain medications and exercise such as walking.  Consideration for a lymph pump will also be made based upon the effectiveness of conservative therapy.  This would help to improve the edema control and prevent sequela such as ulcers and infections   Patient should undergo duplex ultrasound of the venous system to ensure that DVT or reflux is not present.  The patient will follow-up with me after the ultrasound.   - VAS LOWER EXTREMITY VENOUS REFLUX; Future  2. Essential hypertension Continue antihypertensive medications as already  ordered, these medications have been reviewed and there are no changes at this time.   3. Hypothyroidism, unspecified type Continue hormone replacement as ordered and reviewed, no changes at this time   4. Other hyperlipidemia Continue statin as ordered and reviewed, no changes at this time     Levora Dredge, MD  11/24/2021 7:35 AM

## 2022-02-15 ENCOUNTER — Other Ambulatory Visit: Payer: Self-pay | Admitting: Physician Assistant

## 2022-02-15 DIAGNOSIS — I10 Essential (primary) hypertension: Secondary | ICD-10-CM

## 2022-02-15 DIAGNOSIS — E039 Hypothyroidism, unspecified: Secondary | ICD-10-CM

## 2022-02-19 ENCOUNTER — Encounter (INDEPENDENT_AMBULATORY_CARE_PROVIDER_SITE_OTHER): Payer: Self-pay

## 2022-02-19 ENCOUNTER — Ambulatory Visit: Payer: Self-pay

## 2022-02-19 VITALS — BP 134/71 | HR 72 | Resp 14

## 2022-02-19 DIAGNOSIS — Z0130 Encounter for examination of blood pressure without abnormal findings: Secondary | ICD-10-CM

## 2022-02-19 NOTE — Progress Notes (Signed)
Pt presents today for BP check. CL,RMA 

## 2022-02-21 ENCOUNTER — Other Ambulatory Visit: Payer: Self-pay

## 2022-02-21 MED ORDER — LEVOTHYROXINE SODIUM 125 MCG PO TABS: 125.0000 ug | ORAL_TABLET | Freq: Every day | ORAL | 3 refills | Status: DC

## 2022-02-26 NOTE — Progress Notes (Deleted)
MRN : [id number]  Laura Walls is a 61 y.o. ([birth year]) female who presents with chief complaint of legs swell.  History of Present Illness: ***  No outpatient medications have been marked as taking for the 03/01/22 encounter (Appointment) with Gilda Crease, Latina Craver, MD.    Past Medical History:  Diagnosis Date   Carpal tunnel syndrome    both wrist   Hormone disorder    Hypertension    Thyroid disease     Past Surgical History:  Procedure Laterality Date   ABDOMINAL HYSTERECTOMY     APPENDECTOMY     CARPAL TUNNEL RELEASE     CESAREAN SECTION     HEEL SPUR EXCISION      Social History Social History   Tobacco Use   Smoking status: Never   Smokeless tobacco: Never  Vaping Use   Vaping Use: Never used  Substance Use Topics   Alcohol use: Not Currently   Drug use: Never    Family History Family History  Problem Relation Age of Onset   Colon cancer Mother    Leukemia Father    Breast cancer Neg Hx    Bladder Cancer Neg Hx    Kidney cancer Neg Hx     No Known Allergies   REVIEW OF SYSTEMS (Negative unless checked)  Constitutional: [] Weight loss  [] Fever  [] Chills Cardiac: [] Chest pain   [] Chest pressure   [] Palpitations   [] Shortness of breath when laying flat   [] Shortness of breath with exertion. Vascular:  [] Pain in legs with walking   [x] Pain in legs with standing  [] History of DVT   [] Phlebitis   [x] Swelling in legs   [] Varicose veins   [] Non-healing ulcers Pulmonary:   [] Uses home oxygen   [] Productive cough   [] Hemoptysis   [] Wheeze  [] COPD   [] Asthma Neurologic:  [] Dizziness   [] Seizures   [] History of stroke   [] History of TIA  [] Aphasia   [] Vissual changes   [] Weakness or numbness in arm   [] Weakness or numbness in leg Musculoskeletal:   [] Joint swelling   [] Joint pain   [] Low back pain Hematologic:  [] Easy bruising  [] Easy bleeding   [] Hypercoagulable state   [] Anemic Gastrointestinal:  [] Diarrhea   [] Vomiting  [] Gastroesophageal  reflux/heartburn   [] Difficulty swallowing. Genitourinary:  [] Chronic kidney disease   [] Difficult urination  [] Frequent urination   [] Blood in urine Skin:  [] Rashes   [] Ulcers  Psychological:  [] History of anxiety   []  History of major depression.  Physical Examination  There were no vitals filed for this visit. There is no height or weight on file to calculate BMI. Gen: WD/WN, NAD Head: [place]/AT, No temporalis wasting.  Ear/Nose/Throat: Hearing grossly intact, nares w/o erythema or drainage, pinna without lesions Eyes: PER, EOMI, sclera nonicteric.  Neck: Supple, no gross masses.  No JVD.  Pulmonary:  Good air movement, no audible wheezing, no use of accessory muscles.  Cardiac: RRR, precordium not hyperdynamic. Vascular:  scattered varicosities present bilaterally.  Mild venous stasis changes to the legs bilaterally.  3-4+ soft pitting edema, CEAP [id number]  Vessel Right Left  Radial Palpable Palpable  Gastrointestinal: soft, non-distended. No guarding/no peritoneal signs.  Musculoskeletal: M/S 5/5 throughout.  No deformity.  Neurologic: CN 2-12 intact. Pain and light touch intact in extremities.  Symmetrical.  Speech is fluent. Motor exam as listed above. Psychiatric: Judgment intact, Mood & affect appropriate for pt's clinical situation. Dermatologic: Venous rashes no ulcers noted.  No changes consistent with cellulitis. Lymph :  No lichenification or skin changes of chronic lymphedema.  CBC Lab Results  Component Value Date   WBC 8.0 08/30/2021   HGB 12.5 08/30/2021   HCT 38.6 08/30/2021   MCV 83 08/30/2021   PLT 431 08/30/2021    BMET    Component Value Date/Time   NA 139 08/30/2021 0858   K 3.5 08/30/2021 0858   CL 98 08/30/2021 0858   CO2 30 08/26/2021 1711   GLUCOSE 149 (H) 08/30/2021 0858   GLUCOSE 93 08/26/2021 1711   BUN 14 08/30/2021 0858   CREATININE 0.94 08/30/2021 0858   CALCIUM 9.7 08/30/2021 0858   GFRNONAA >60 08/26/2021 1711   GFRAA 77 12/30/2019 0859    CrCl cannot be calculated (Patient's most recent lab result is older than the maximum 21 days allowed.).  COAG No results found for: "INR", "PROTIME"  Radiology No results found.   Assessment/Plan There are no diagnoses linked to this encounter.   Hortencia Pilar, MD  02/26/2022 1:00 PM

## 2022-03-01 ENCOUNTER — Encounter (INDEPENDENT_AMBULATORY_CARE_PROVIDER_SITE_OTHER): Payer: PRIVATE HEALTH INSURANCE

## 2022-03-01 ENCOUNTER — Ambulatory Visit (INDEPENDENT_AMBULATORY_CARE_PROVIDER_SITE_OTHER): Payer: PRIVATE HEALTH INSURANCE | Admitting: Vascular Surgery

## 2022-03-01 DIAGNOSIS — E7849 Other hyperlipidemia: Secondary | ICD-10-CM

## 2022-03-01 DIAGNOSIS — I890 Lymphedema, not elsewhere classified: Secondary | ICD-10-CM

## 2022-03-01 DIAGNOSIS — I10 Essential (primary) hypertension: Secondary | ICD-10-CM

## 2022-04-09 ENCOUNTER — Encounter: Payer: Self-pay | Admitting: Orthopedic Surgery

## 2022-04-09 ENCOUNTER — Ambulatory Visit: Payer: Self-pay

## 2022-04-09 ENCOUNTER — Ambulatory Visit (INDEPENDENT_AMBULATORY_CARE_PROVIDER_SITE_OTHER): Payer: PRIVATE HEALTH INSURANCE | Admitting: Orthopedic Surgery

## 2022-04-09 ENCOUNTER — Ambulatory Visit (INDEPENDENT_AMBULATORY_CARE_PROVIDER_SITE_OTHER): Payer: PRIVATE HEALTH INSURANCE

## 2022-04-09 DIAGNOSIS — M25562 Pain in left knee: Secondary | ICD-10-CM

## 2022-04-09 DIAGNOSIS — M25561 Pain in right knee: Secondary | ICD-10-CM

## 2022-04-09 DIAGNOSIS — G8929 Other chronic pain: Secondary | ICD-10-CM

## 2022-04-09 DIAGNOSIS — M170 Bilateral primary osteoarthritis of knee: Secondary | ICD-10-CM | POA: Diagnosis not present

## 2022-04-09 NOTE — Progress Notes (Signed)
Office Visit Note   Patient: Laura Walls           Date of Birth: 1960/10/10           MRN: [id number] Visit Date: 04/09/2022              Requested by: No referring provider defined for this encounter. PCP: System, Provider Not In  Chief Complaint  Patient presents with   Right Knee - Pain   Left Knee - Pain      HPI: Patient is a 61 year old woman with advanced osteoarthritis of both knees.  She states that this time the pain is worse in the left knee than the right knee.  She has had several steroid injections with minimal relief.  Patient states the pain seems to be getting worse over the past several weeks.  She has difficulty going up and down stairs she has been using ibuprofen.  Assessment & Plan: Visit Diagnoses:  1. Chronic pain of both knees   2. Bilateral primary osteoarthritis of knee     Plan: With her progressive arthritis and pain with activities of daily living patient states she would like to proceed with total knee arthroplasty.  She currently lives alone and she will set up a situation where she can be cared for at home and we will need to set her up for [facility] rehab therapy.  She lives in [place].  Recommended trying Naprosyn 400 mg twice a day with meals for pain.  Follow-Up Instructions: No follow-ups on file.   Ortho Exam  Patient is alert, oriented, no adenopathy, well-dressed, normal affect, normal respiratory effort. Examination patient lacks 10 degrees to full extension of both knees.  She has crepitation with range of motion of both knees collaterals and cruciates are stable.  There is mild effusion of both knees.  Patient states that her family is in [place].  Imaging: XR Knee 1-2 Views Right  Result Date: 04/09/2022 2 view radiographs of the right knee shows tricompartmental arthritic changes with osteophytic bone spurs superiorly and inferiorly to the patella.  Osteophytic bone spurs medial and lateral joint line with subchondral  or sclerosis and cysts.  XR Knee 1-2 Views Left  Result Date: 04/09/2022 2 view radiographs of the left knee shows large osteophytic bone spurs in the patellofemoral joint with neutral alignment varus valgus with subchondral or cysts and sclerosis with osteophytic bone spurs laterally.  No images are attached to the encounter.  Labs: Lab Results  Component Value Date   HGBA1C 5.0 08/30/2021   ESRSEDRATE 39 07/02/2019   LABURIC 6.9 08/30/2021   LABURIC 6.8 08/22/2020   LABURIC 6.0 12/30/2019     Lab Results  Component Value Date   ALBUMIN 4.1 08/30/2021   ALBUMIN 4.2 08/22/2020   ALBUMIN 4.0 12/30/2019    No results found for: "MG" Lab Results  Component Value Date   VD25OH 61.6 03/23/2020   VD25OH 20.6 (L) 01/01/2020    No results found for: "PREALBUMIN"    Latest Ref Rng & Units 08/30/2021    8:58 AM 08/26/2021    5:11 PM 08/22/2020    8:35 AM  CBC EXTENDED  WBC 3.4 - 10.8 x10E3/uL 8.0  10.1  7.0   RBC 3.77 - 5.28 x10E6/uL 4.65  4.32  4.70   Hemoglobin 11.1 - 15.9 g/dL 02.6  37.8  58.8   HCT 34.0 - 46.6 % 38.6  35.2  38.9   Platelets 150 - 450 x10E3/uL 431  437  382   NEUT# 1.4 - 7.0 x10E3/uL 5.5   3.2   Lymph# 0.7 - 3.1 x10E3/uL 2.3   3.1      There is no height or weight on file to calculate BMI.  Orders:  Orders Placed This Encounter  Procedures   XR Knee 1-2 Views Left   XR Knee 1-2 Views Right   No orders of the defined types were placed in this encounter.    Procedures: No procedures performed  Clinical Data: No additional findings.  ROS:  All other systems negative, except as noted in the HPI. Review of Systems  Objective: Vital Signs: There were no vitals taken for this visit.  Specialty Comments:  No specialty comments available.  PMFS History: Patient Active Problem List   Diagnosis Date Noted   Lymphedema 11/24/2021   Vitamin D deficiency 01/13/2020   Hyperlipidemia 01/22/2019   Hypothyroid 01/22/2019   Breast cancer  screening by mammogram 01/22/2019   Essential hypertension 01/22/2019   Acute pain of left knee 01/22/2019   Hematuria 01/22/2019   Class 2 severe obesity due to excess calories with serious comorbidity and body mass index (BMI) of 36.0 to 36.9 in adult [facility]) 01/22/2019   Lower extremity edema 01/22/2019   Family history of colon cancer in mother 01/22/2019   Past Medical History:  Diagnosis Date   Carpal tunnel syndrome    both wrist   Hormone disorder    Hypertension    Thyroid disease     Family History  Problem Relation Age of Onset   Colon cancer Mother    Leukemia Father    Breast cancer Neg Hx    Bladder Cancer Neg Hx    Kidney cancer Neg Hx     Past Surgical History:  Procedure Laterality Date   ABDOMINAL HYSTERECTOMY     APPENDECTOMY     CARPAL TUNNEL RELEASE     CESAREAN SECTION     HEEL SPUR EXCISION     Social History   Occupational History   Not on file  Tobacco Use   Smoking status: Never   Smokeless tobacco: Never  Vaping Use   Vaping Use: Never used  Substance and Sexual Activity   Alcohol use: Not Currently   Drug use: Never   Sexual activity: Not Currently

## 2022-04-11 ENCOUNTER — Other Ambulatory Visit (INDEPENDENT_AMBULATORY_CARE_PROVIDER_SITE_OTHER): Payer: Self-pay | Admitting: Vascular Surgery

## 2022-04-17 NOTE — Progress Notes (Deleted)
MRN : [id number]  Laura Walls is a 61 y.o. (Sep 22, 1960) female who presents with chief complaint of legs swell.  History of Present Illness:  The patient returns to the office for followup evaluation regarding leg swelling.  The swelling has persisted and the pain associated with swelling continues. There have not been any interval development of a ulcerations or wounds.  Since the previous visit the patient has been wearing graduated compression stockings and has noted little if any improvement in the lymphedema. The patient has been using compression routinely morning until night.  The patient also states elevation during the day and exercise is being done too.  No outpatient medications have been marked as taking for the 04/26/22 encounter (Appointment) with Gilda Crease, Latina Craver, MD.    Past Medical History:  Diagnosis Date   Carpal tunnel syndrome    both wrist   Hormone disorder    Hypertension    Thyroid disease     Past Surgical History:  Procedure Laterality Date   ABDOMINAL HYSTERECTOMY     APPENDECTOMY     CARPAL TUNNEL RELEASE     CESAREAN SECTION     HEEL SPUR EXCISION      Social History Social History   Tobacco Use   Smoking status: Never   Smokeless tobacco: Never  Vaping Use   Vaping Use: Never used  Substance Use Topics   Alcohol use: Not Currently   Drug use: Never    Family History Family History  Problem Relation Age of Onset   Colon cancer Mother    Leukemia Father    Breast cancer Neg Hx    Bladder Cancer Neg Hx    Kidney cancer Neg Hx     No Known Allergies   REVIEW OF SYSTEMS (Negative unless checked)  Constitutional: [] Weight loss  [] Fever  [] Chills Cardiac: [] Chest pain   [] Chest pressure   [] Palpitations   [] Shortness of breath when laying flat   [] Shortness of breath with exertion. Vascular:  [] Pain in legs with walking   [x] Pain in legs with standing  [] History of DVT   [] Phlebitis   [x] Swelling in legs   [] Varicose  veins   [] Non-healing ulcers Pulmonary:   [] Uses home oxygen   [] Productive cough   [] Hemoptysis   [] Wheeze  [] COPD   [] Asthma Neurologic:  [] Dizziness   [] Seizures   [] History of stroke   [] History of TIA  [] Aphasia   [] Vissual changes   [] Weakness or numbness in arm   [] Weakness or numbness in leg Musculoskeletal:   [] Joint swelling   [] Joint pain   [] Low back pain Hematologic:  [] Easy bruising  [] Easy bleeding   [] Hypercoagulable state   [] Anemic Gastrointestinal:  [] Diarrhea   [] Vomiting  [] Gastroesophageal reflux/heartburn   [] Difficulty swallowing. Genitourinary:  [] Chronic kidney disease   [] Difficult urination  [] Frequent urination   [] Blood in urine Skin:  [] Rashes   [] Ulcers  Psychological:  [] History of anxiety   []  History of major depression.  Physical Examination  There were no vitals filed for this visit. There is no height or weight on file to calculate BMI. Gen: WD/WN, NAD Head: [place]/AT, No temporalis wasting.  Ear/Nose/Throat: Hearing grossly intact, nares w/o erythema or drainage, pinna without lesions Eyes: PER, EOMI, sclera nonicteric.  Neck: Supple, no gross masses.  No JVD.  Pulmonary:  Good air movement, no audible wheezing, no use of accessory muscles.  Cardiac: RRR, precordium not hyperdynamic. Vascular:  scattered varicosities present bilaterally.  Mild venous  stasis changes to the legs bilaterally.  3-4+ soft pitting edema, CEAP [id number]  Vessel Right Left  Radial Palpable Palpable  Gastrointestinal: soft, non-distended. No guarding/no peritoneal signs.  Musculoskeletal: M/S 5/5 throughout.  No deformity.  Neurologic: CN 2-12 intact. Pain and light touch intact in extremities.  Symmetrical.  Speech is fluent. Motor exam as listed above. Psychiatric: Judgment intact, Mood & affect appropriate for pt's clinical situation. Dermatologic: Venous rashes no ulcers noted.  No changes consistent with cellulitis. Lymph : No lichenification or skin changes of chronic  lymphedema.  CBC Lab Results  Component Value Date   WBC 8.0 08/30/2021   HGB 12.5 08/30/2021   HCT 38.6 08/30/2021   MCV 83 08/30/2021   PLT 431 08/30/2021    BMET    Component Value Date/Time   NA 139 08/30/2021 0858   K 3.5 08/30/2021 0858   CL 98 08/30/2021 0858   CO2 30 08/26/2021 1711   GLUCOSE 149 (H) 08/30/2021 0858   GLUCOSE 93 08/26/2021 1711   BUN 14 08/30/2021 0858   CREATININE 0.94 08/30/2021 0858   CALCIUM 9.7 08/30/2021 0858   GFRNONAA >60 08/26/2021 1711   GFRAA 77 12/30/2019 0859   CrCl cannot be calculated (Patient's most recent lab result is older than the maximum 21 days allowed.).  COAG No results found for: "INR", "PROTIME"  Radiology XR Knee 1-2 Views Right  Result Date: 04/09/2022 2 view radiographs of the right knee shows tricompartmental arthritic changes with osteophytic bone spurs superiorly and inferiorly to the patella.  Osteophytic bone spurs medial and lateral joint line with subchondral or sclerosis and cysts.  XR Knee 1-2 Views Left  Result Date: 04/09/2022 2 view radiographs of the left knee shows large osteophytic bone spurs in the patellofemoral joint with neutral alignment varus valgus with subchondral or cysts and sclerosis with osteophytic bone spurs laterally.    Assessment/Plan There are no diagnoses linked to this encounter.   Levora Dredge, MD  04/17/2022 3:52 PM

## 2022-04-20 ENCOUNTER — Other Ambulatory Visit (INDEPENDENT_AMBULATORY_CARE_PROVIDER_SITE_OTHER): Payer: Self-pay | Admitting: Vascular Surgery

## 2022-04-20 DIAGNOSIS — R609 Edema, unspecified: Secondary | ICD-10-CM

## 2022-04-26 ENCOUNTER — Telehealth: Payer: Self-pay

## 2022-04-26 ENCOUNTER — Ambulatory Visit (INDEPENDENT_AMBULATORY_CARE_PROVIDER_SITE_OTHER): Payer: PRIVATE HEALTH INSURANCE | Admitting: Vascular Surgery

## 2022-04-26 ENCOUNTER — Encounter (INDEPENDENT_AMBULATORY_CARE_PROVIDER_SITE_OTHER): Payer: PRIVATE HEALTH INSURANCE

## 2022-04-26 ENCOUNTER — Ambulatory Visit: Payer: PRIVATE HEALTH INSURANCE | Admitting: Orthopedic Surgery

## 2022-04-26 DIAGNOSIS — E039 Hypothyroidism, unspecified: Secondary | ICD-10-CM

## 2022-04-26 DIAGNOSIS — I10 Essential (primary) hypertension: Secondary | ICD-10-CM

## 2022-04-26 DIAGNOSIS — I890 Lymphedema, not elsewhere classified: Secondary | ICD-10-CM

## 2022-04-26 DIAGNOSIS — E7849 Other hyperlipidemia: Secondary | ICD-10-CM

## 2022-04-26 NOTE — Telephone Encounter (Signed)
Patient called stating that she has been having right knee pain, but pain got worse yesterday.  Stated that right knee is in severe pain when applying pressure to right leg. Taking Ibuprofen, but pain comes back after medication wears off.  CB# [phone].  Please advise.  Thank you.

## 2022-04-26 NOTE — Telephone Encounter (Signed)
SW pt advised her to continue to use the ibuprofen every 6 hours as needed to control her pain better. She can ice her knee during the day. Keep it elevated. She said something about insurance approval for the knee replacement Dr. Sharol Given suggested to her at her last OV. Malachy Mood, do you happen to know any information about this?

## 2022-05-09 NOTE — Telephone Encounter (Signed)
I spoke with Laura Walls.  She is debating on having surgery here vs. [place] where is from and has family.  She does not have anyone in [place] that could help with her post operatively.  We discussed the surgery and the required physical therapy that she would need post operatively and it would be difficult to go home and no one to help.  She is going to think about this and let me know if she decides to proceed with Laura Walls doing the surgery or having done in [place].  Advised her to call either way if she needs anything.

## 2022-05-30 DIAGNOSIS — M25561 Pain in right knee: Secondary | ICD-10-CM | POA: Diagnosis not present

## 2022-05-30 DIAGNOSIS — Z6838 Body mass index (BMI) 38.0-38.9, adult: Secondary | ICD-10-CM | POA: Diagnosis not present

## 2022-05-30 DIAGNOSIS — M1711 Unilateral primary osteoarthritis, right knee: Secondary | ICD-10-CM | POA: Diagnosis not present

## 2022-06-25 ENCOUNTER — Other Ambulatory Visit: Payer: Self-pay | Admitting: Physician Assistant

## 2022-06-25 ENCOUNTER — Telehealth: Payer: Self-pay

## 2022-06-25 DIAGNOSIS — E039 Hypothyroidism, unspecified: Secondary | ICD-10-CM

## 2022-06-25 DIAGNOSIS — I10 Essential (primary) hypertension: Secondary | ICD-10-CM

## 2022-06-25 MED ORDER — HYDROCHLOROTHIAZIDE 25 MG PO TABS: 25.0000 mg | ORAL_TABLET | Freq: Every day | ORAL | 3 refills | Status: DC

## 2022-06-25 MED ORDER — LEVOTHYROXINE SODIUM 125 MCG PO TABS: 125.0000 ug | ORAL_TABLET | Freq: Every day | ORAL | 3 refills | Status: DC

## 2022-06-25 NOTE — Telephone Encounter (Signed)
Duplicate request  Electronic request from pharmacy already in [place]. Re-routed the request to Randel Pigg, PA-C  [facility]

## 2022-07-16 ENCOUNTER — Other Ambulatory Visit: Payer: Self-pay | Admitting: Internal Medicine

## 2022-07-16 DIAGNOSIS — R0602 Shortness of breath: Secondary | ICD-10-CM | POA: Diagnosis not present

## 2022-07-16 DIAGNOSIS — G4730 Sleep apnea, unspecified: Secondary | ICD-10-CM | POA: Diagnosis not present

## 2022-07-16 DIAGNOSIS — E785 Hyperlipidemia, unspecified: Secondary | ICD-10-CM | POA: Diagnosis not present

## 2022-07-16 DIAGNOSIS — I890 Lymphedema, not elsewhere classified: Secondary | ICD-10-CM | POA: Diagnosis not present

## 2022-07-16 DIAGNOSIS — I10 Essential (primary) hypertension: Secondary | ICD-10-CM | POA: Diagnosis not present

## 2022-07-26 ENCOUNTER — Ambulatory Visit
Admission: RE | Admit: 2022-07-26 | Discharge: 2022-07-26 | Disposition: A | Discharge status: Home / Self Care | Payer: Self-pay | Source: Ambulatory Visit | Attending: Internal Medicine | Admitting: Internal Medicine

## 2022-07-26 DIAGNOSIS — R0602 Shortness of breath: Secondary | ICD-10-CM | POA: Insufficient documentation

## 2022-07-26 DIAGNOSIS — I10 Essential (primary) hypertension: Secondary | ICD-10-CM | POA: Insufficient documentation

## 2022-08-02 DIAGNOSIS — R0602 Shortness of breath: Secondary | ICD-10-CM | POA: Diagnosis not present

## 2022-08-09 ENCOUNTER — Other Ambulatory Visit: Payer: Self-pay | Admitting: Physician Assistant

## 2022-08-20 DIAGNOSIS — E785 Hyperlipidemia, unspecified: Secondary | ICD-10-CM | POA: Diagnosis not present

## 2022-08-20 DIAGNOSIS — G4730 Sleep apnea, unspecified: Secondary | ICD-10-CM | POA: Diagnosis not present

## 2022-08-20 DIAGNOSIS — I10 Essential (primary) hypertension: Secondary | ICD-10-CM | POA: Diagnosis not present

## 2022-08-20 DIAGNOSIS — I890 Lymphedema, not elsewhere classified: Secondary | ICD-10-CM | POA: Diagnosis not present

## 2022-08-20 DIAGNOSIS — E669 Obesity, unspecified: Secondary | ICD-10-CM | POA: Diagnosis not present

## 2022-08-20 DIAGNOSIS — R0602 Shortness of breath: Secondary | ICD-10-CM | POA: Diagnosis not present

## 2022-09-14 ENCOUNTER — Ambulatory Visit: Payer: Self-pay

## 2022-09-14 DIAGNOSIS — Z0000 Encounter for general adult medical examination without abnormal findings: Secondary | ICD-10-CM

## 2022-09-14 LAB — POCT URINALYSIS DIPSTICK
Bilirubin, UA: NEGATIVE
Glucose, UA: NEGATIVE
Ketones, UA: NEGATIVE
Leukocytes, UA: NEGATIVE
Nitrite, UA: NEGATIVE
Protein, UA: NEGATIVE
Spec Grav, UA: 1.015 (ref 1.010–1.025)
Urobilinogen, UA: 0.2 E.U./dL
pH, UA: 6 (ref 5.0–8.0)

## 2022-09-14 NOTE — Progress Notes (Signed)
Pt recently had apt with cardiologist, Dr. Juliann Pares, echo and EKG was performe, [facility].

## 2022-09-15 LAB — CMP12+LP+TP+TSH+6AC+CBC/D/PLT
ALT: 13 IU/L (ref 0–32)
AST: 18 IU/L (ref 0–40)
Albumin/Globulin Ratio: 1.3 (ref 1.2–2.2)
Albumin: 4 g/dL (ref 3.9–4.9)
Alkaline Phosphatase: 103 IU/L (ref 44–121)
BUN/Creatinine Ratio: 18 (ref 12–28)
BUN: 18 mg/dL (ref 8–27)
Basophils Absolute: 0.1 10*3/uL (ref 0.0–0.2)
Basos: 1 %
Bilirubin Total: 0.5 mg/dL (ref 0.0–1.2)
Calcium: 9.3 mg/dL (ref 8.7–10.3)
Chloride: 101 mmol/L (ref 96–106)
Chol/HDL Ratio: 4.3 ratio (ref 0.0–4.4)
Cholesterol, Total: 169 mg/dL (ref 100–199)
Creatinine, Ser: 1.01 mg/dL — ABNORMAL HIGH (ref 0.57–1.00)
EOS (ABSOLUTE): 0.4 10*3/uL (ref 0.0–0.4)
Eos: 6 %
Estimated CHD Risk: 1 times avg. (ref 0.0–1.0)
Free Thyroxine Index: 2.2 (ref 1.2–4.9)
GGT: 26 IU/L (ref 0–60)
Globulin, Total: 3.1 g/dL (ref 1.5–4.5)
Glucose: 84 mg/dL (ref 70–99)
HDL: 39 mg/dL — ABNORMAL LOW (ref >39)
Hematocrit: 38.8 % (ref 34.0–46.6)
Hemoglobin: 12.4 g/dL (ref 11.1–15.9)
Immature Grans (Abs): 0 10*3/uL (ref 0.0–0.1)
Immature Granulocytes: 0 %
Iron: 69 ug/dL (ref 27–139)
LDH: 157 IU/L (ref 119–226)
LDL Chol Calc (NIH): 109 mg/dL — ABNORMAL HIGH (ref 0–99)
Lymphocytes Absolute: 3.1 10*3/uL (ref 0.7–3.1)
Lymphs: 42 %
MCH: 26.6 pg (ref 26.6–33.0)
MCHC: 32 g/dL (ref 31.5–35.7)
MCV: 83 fL (ref 79–97)
Monocytes Absolute: 0.3 10*3/uL (ref 0.1–0.9)
Monocytes: 5 %
Neutrophils Absolute: 3.4 10*3/uL (ref 1.4–7.0)
Neutrophils: 46 %
Phosphorus: 3.6 mg/dL (ref 3.0–4.3)
Platelets: 405 10*3/uL (ref 150–450)
Potassium: 4.1 mmol/L (ref 3.5–5.2)
RBC: 4.66 x10E6/uL (ref 3.77–5.28)
RDW: 13.6 % (ref 11.7–15.4)
Sodium: 141 mmol/L (ref 134–144)
T3 Uptake Ratio: 26 % (ref 24–39)
T4, Total: 8.5 ug/dL (ref 4.5–12.0)
TSH: 6.07 u[IU]/mL — ABNORMAL HIGH (ref 0.450–4.500)
Total Protein: 7.1 g/dL (ref 6.0–8.5)
Triglycerides: 114 mg/dL (ref 0–149)
Uric Acid: 7.9 mg/dL — ABNORMAL HIGH (ref 3.0–7.2)
VLDL Cholesterol Cal: 21 mg/dL (ref 5–40)
WBC: 7.3 10*3/uL (ref 3.4–10.8)
eGFR: 63 mL/min/{1.73_m2} (ref >59)

## 2022-09-19 ENCOUNTER — Encounter: Payer: Self-pay | Admitting: Physician Assistant

## 2022-09-19 ENCOUNTER — Ambulatory Visit: Payer: Self-pay | Admitting: Physician Assistant

## 2022-09-19 VITALS — BP 140/78 | HR 68 | Temp 98.0°F | Resp 16 | Wt 248.0 lb

## 2022-09-19 DIAGNOSIS — Z0000 Encounter for general adult medical examination without abnormal findings: Secondary | ICD-10-CM

## 2022-09-19 DIAGNOSIS — I10 Essential (primary) hypertension: Secondary | ICD-10-CM

## 2022-09-19 DIAGNOSIS — E038 Other specified hypothyroidism: Secondary | ICD-10-CM

## 2022-09-19 NOTE — Progress Notes (Signed)
[facility]  ____________________________________________   None    (approximate)  I have reviewed the triage vital signs and the nursing notes.   HISTORY  Chief Complaint Annual Exam   HPI Laura Walls is a 62 y.o. female patient presents for annual physical exam.  Patient voiced no concerns or complaints.         Past Medical History:  Diagnosis Date   Carpal tunnel syndrome    both wrist   Hormone disorder    Hypertension    Thyroid disease     Patient Active Problem List   Diagnosis Date Noted   Lymphedema 11/24/2021   Vitamin D deficiency 01/13/2020   Hyperlipidemia 01/22/2019   Hypothyroid 01/22/2019   Breast cancer screening by mammogram 01/22/2019   Essential hypertension 01/22/2019   Acute pain of left knee 01/22/2019   Hematuria 01/22/2019   Class 2 severe obesity due to excess calories with serious comorbidity and body mass index (BMI) of 36.0 to 36.9 in adult [facility]) 01/22/2019   Lower extremity edema 01/22/2019   Family history of colon cancer in mother 01/22/2019    Past Surgical History:  Procedure Laterality Date   ABDOMINAL HYSTERECTOMY     APPENDECTOMY     CARPAL TUNNEL RELEASE     CESAREAN SECTION     HEEL SPUR EXCISION      Prior to Admission medications   Medication Sig Start Date End Date Taking? Authorizing Provider  albuterol (VENTOLIN HFA) 108 (90 Base) MCG/ACT inhaler Inhale 2 puffs into the lungs every 4 (four) hours as needed (as needed).   Yes [provider]  atorvastatin (LIPITOR) 10 MG tablet Take 1 tablet by mouth daily.   Yes [provider]  furosemide (LASIX) 20 MG tablet Take 20 mg by mouth daily. 07/30/22  Yes [provider]  hydrochlorothiazide (HYDRODIURIL) 25 MG tablet Take 1 tablet by mouth daily.   Yes [provider]  levothyroxine (SYNTHROID) 125 MCG tablet Take 1 tablet by mouth daily.   Yes [provider]  meclizine  (ANTIVERT) 25 MG tablet Take 25 mg by mouth daily as needed.   Yes [provider]  MOUNJARO 2.5 MG/0.5ML Pen PLEASE SEE ATTACHED FOR DETAILED DIRECTIONS Patient not taking: Reported on 09/19/2022 07/12/22   [provider]    Allergies Patient has no known allergies.  Family History  Problem Relation Age of Onset   Colon cancer Mother    Leukemia Father    Breast cancer Neg Hx    Bladder Cancer Neg Hx    Kidney cancer Neg Hx     Social History Social History   Tobacco Use   Smoking status: Never   Smokeless tobacco: Never  Vaping Use   Vaping Use: Never used  Substance Use Topics   Alcohol use: Not Currently   Drug use: Never    Review of Systems Constitutional: No fever/chills Eyes: No visual changes. ENT: No sore throat. Cardiovascular: Denies chest pain. Respiratory: Denies shortness of breath. Gastrointestinal: No abdominal pain.  No nausea, no vomiting.  No diarrhea.  No constipation. Genitourinary: Negative for dysuria. Musculoskeletal: Negative for back pain. Skin: Negative for rash. Neurological: Negative for headaches, focal weakness or numbness. Endocrine: Hyperlipidemia, hypertension, and hypothyroidism.  ____________________________________________   PHYSICAL EXAM:  VITAL SIGNS: BP 140/78  Pulse 68  Resp 16  Temp 98 F (36.7 C)  Temp src Temporal  SpO2 99 %  Weight 248 lb (112.5 kg)   BMI  37.71 kg/m2  BSA 2.32 m2     Constitutional: Alert and oriented. Well appearing and in no acute distress. Eyes: Conjunctivae are normal. PERRL. EOMI. Head: Atraumatic. Nose: No congestion/rhinnorhea. Mouth/Throat: Mucous membranes are moist.  Oropharynx non-erythematous. Neck: No stridor.  No cervical spine tenderness to palpation. Hematological/Lymphatic/Immunilogical: No cervical lymphadenopathy. Cardiovascular: Normal rate, regular rhythm. Grossly normal heart sounds.  Good peripheral circulation. Respiratory: Normal respiratory  effort.  No retractions. Lungs CTAB. Gastrointestinal: Soft and nontender. No distention. No abdominal bruits. No CVA tenderness. Genitourinary: Deferred Musculoskeletal: No lower extremity tenderness nor edema.  No joint effusions. Neurologic:  Normal speech and language. No gross focal neurologic deficits are appreciated. No gait instability. Skin:  Skin is warm, dry and intact. No rash noted. Psychiatric: Mood and affect are normal. Speech and behavior are normal.  ____________________________________________   LABS           Component Ref Range & Units 5 d ago (09/14/22) 1 yr ago (08/30/21) 1 yr ago (08/09/21) 2 yr ago (08/22/20) 2 yr ago (12/30/19) 3 yr ago (07/02/19) 3 yr ago (02/26/19)  Color, UA yellow Amber yellow Yellow Yellow yellow Amber  Clarity, UA clear Clear cloudy Clear Clear clear cloudy  Glucose, UA Negative Negative Negative Negative Negative Negative Negative Negative  Bilirubin, UA neg Negative negative Negative Negative negative neg  Ketones, UA neg Negative negative Negative Negative negative neg  Spec Grav, UA 1.010 - 1.025 1.015 1.025 1.025 1.015 1.020 1.015 1.025  Blood, UA 1+ Abnormal  Positive CM 1+ Positive CM Positive CM positive CM 1+ CM  pH, UA 5.0 - 8.0 6.0 6.0 6.0 6.0 6.0 6.0 6.0  Protein, UA Negative Negative Negative Negative Negative Negative Negative Negative  Urobilinogen, UA 0.2 or 1.0 E.U./dL 0.2 0.2 0.2 0.2 0.2 0.2 0.2  Nitrite, UA neg Negative negative Negative Negative negative neg  Leukocytes, UA Negative Negative Negative Large (3+) Abnormal  Negative Negative Negative Negative  Appearance         Odor                     View All Conversations on this Encounter                          Component Ref Range & Units 5 d ago (09/14/22) 1 yr ago (08/30/21) 1 yr ago (08/26/21) 1 yr ago (08/26/21) 2 yr ago (08/22/20) 2 yr ago (07/10/20) 2 yr ago (07/10/20)  Glucose 70 - 99 mg/dL 84 161 High  93 CM  95 R 97 CM   Uric Acid 3.0  - 7.2 mg/dL 7.9 High  6.9 CM   6.8 CM    Comment:            Therapeutic target for gout patients: <6.0  BUN 8 - 27 mg/dL 18 14 14  R  18 12 R   Creatinine, Ser 0.57 - 1.00 mg/dL 0.96 High  0.45 4.09 R  0.98 0.88 R   eGFR >59 mL/min/1.73 63 69   66    BUN/Creatinine Ratio 12 - 28 18 15   18     Sodium 134 - 144 mmol/L 141 139 142 R  143 138 R   Potassium 3.5 - 5.2 mmol/L 4.1 3.5 3.2 Low  R  3.7 3.8 R   Chloride 96 - 106 mmol/L 101 98 104 R  99 107 R   Calcium 8.7 - 10.3 mg/dL 9.3 9.7 9.1 R  9.3 8.5  Low  R   Phosphorus 3.0 - 4.3 mg/dL 3.6 4.8 High    3.8    Total Protein 6.0 - 8.5 g/dL 7.1 7.3   7.5    Albumin 3.9 - 4.9 g/dL 4.0 4.1 R   4.2 R    Globulin, Total 1.5 - 4.5 g/dL 3.1 3.2   3.3    Albumin/Globulin Ratio 1.2 - 2.2 1.3 1.3   1.3    Bilirubin Total 0.0 - 1.2 mg/dL 0.5 0.3   0.5    Alkaline Phosphatase 44 - 121 IU/L 103 96   99    LDH 119 - 226 IU/L 157 149   160    AST 0 - 40 IU/L 18 16   15     ALT 0 - 32 IU/L 13 18   10     GGT 0 - 60 IU/L 26 31   29     Iron 27 - 139 ug/dL 69 68   79    Cholesterol, Total 100 - 199 mg/dL 191 478   295    Triglycerides 0 - 149 mg/dL 621 98   308    HDL >65 mg/dL 39 Low  52   41    VLDL Cholesterol Cal 5 - 40 mg/dL 21 18   20     LDL Chol Calc (NIH) 0 - 99 mg/dL 784 High  696 High    295 High     Chol/HDL Ratio 0.0 - 4.4 ratio 4.3 3.3 CM   4.2 CM    Comment:                                   T. Chol/HDL Ratio                                             Men  Women                               1/2 Avg.Risk  3.4    3.3                                   Avg.Risk  5.0    4.4                                2X Avg.Risk  9.6    7.1                                3X Avg.Risk 23.4   11.0  Estimated CHD Risk 0.0 - 1.0 times avg. 1.0  < 0.5 CM   0.9 CM    Comment: The CHD Risk is based on the T. Chol/HDL ratio. Other factors affect CHD Risk such as hypertension, smoking, diabetes, severe obesity, and family history  of premature CHD.  TSH 0.450 - 4.500 uIU/mL 6.070 High  0.855   3.830    T4, Total 4.5 - 12.0 ug/dL 8.5 9.5   9.3    T3 Uptake Ratio 24 - 39 % 26 29   26     Free Thyroxine  Index 1.2 - 4.9 2.2 2.8   2.4    WBC 3.4 - 10.8 x10E3/uL 7.3 8.0  10.1 R 7.0  5.4 R  RBC 3.77 - 5.28 x10E6/uL 4.66 4.65  4.32 R 4.70  4.13 R  Hemoglobin 11.1 - 15.9 g/dL 16.1 09.6  04.5 Low  R 12.5  11.2 Low  R  Hematocrit 34.0 - 46.6 % 38.8 38.6  35.2 Low  R 38.9  33.2 Low  R  MCV 79 - 97 fL 83 83  81.5 R 83  80.4 R  MCH 26.6 - 33.0 pg 26.6 26.9  26.6 R 26.6  27.1 R  MCHC 31.5 - 35.7 g/dL 40.9 81.1  91.4 R 78.2  33.7 R  RDW 11.7 - 15.4 % 13.6 13.8  13.4 R 13.3  13.4 R  Platelets 150 - 450 x10E3/uL 405 431  437 High  R 382  339 R  Neutrophils Not Estab. % 46 69   46    Lymphs Not Estab. % 42 29   45    Monocytes Not Estab. % 5 1   4     Eos Not Estab. % 6 0   3    Basos Not Estab. % 1 0   1    Neutrophils Absolute 1.4 - 7.0 x10E3/uL 3.4 5.5   3.2    Lymphocytes Absolute 0.7 - 3.1 x10E3/uL 3.1 2.3   3.1    Monocytes Absolute 0.1 - 0.9 x10E3/uL 0.3 0.1   0.3    EOS (ABSOLUTE) 0.0 - 0.4 x10E3/uL 0.4 0.0   0.2    Basophils Absolute 0.0 - 0.2 x10E3/uL 0.1 0.0   0.1    Immature Granulocytes Not Estab. % 0 1   1    Immature Grans (Abs)             ___________________________________________  EKG   normal sinus rhythm, possible left atrial enlargement, eft axis deviation, and RBBB, with a ventricular rate of 70 bpm GI cardiologist visit on 07/16/2022. ____________________________________________    ____________________________________________   INITIAL IMPRESSION / ASSESSMENT AND PLAN  As part of my medical decision making, I reviewed the following data within the electronic MEDICAL RECORD NUMBER       No acute finding on physical exam and EKG.  Patient TSH has increased to 6.070.  Levels last year was 3.830.  Patient states compliance with medication no change in dosage.  Will repeat  TSH.     ____________________________________________   FINAL CLINICAL IMPRESSION Well exam.   ED Discharge Orders     None        Note:  This document was prepared using Dragon voice recognition software and may include unintentional dictation errors.

## 2022-09-19 NOTE — Progress Notes (Signed)
Pt presents today to complete physical,  Pt denies any issues or concerns at this time.  

## 2022-09-20 ENCOUNTER — Other Ambulatory Visit: Payer: Self-pay | Admitting: Physician Assistant

## 2022-09-20 LAB — TSH: TSH: 7.42 u[IU]/mL — ABNORMAL HIGH (ref 0.450–4.500)

## 2022-09-20 MED ORDER — LEVOTHYROXINE SODIUM 150 MCG PO TABS: 150.0000 ug | ORAL_TABLET | Freq: Every day | ORAL | 0 refills | Status: DC

## 2022-10-18 ENCOUNTER — Other Ambulatory Visit: Payer: Self-pay | Admitting: Physician Assistant

## 2022-10-24 DIAGNOSIS — Z23 Encounter for immunization: Secondary | ICD-10-CM | POA: Diagnosis not present

## 2022-11-12 ENCOUNTER — Encounter: Payer: Self-pay | Admitting: Orthopedic Surgery

## 2022-11-12 ENCOUNTER — Telehealth: Payer: Self-pay | Admitting: Orthopedic Surgery

## 2022-11-12 ENCOUNTER — Ambulatory Visit (INDEPENDENT_AMBULATORY_CARE_PROVIDER_SITE_OTHER): Payer: PRIVATE HEALTH INSURANCE | Admitting: Orthopedic Surgery

## 2022-11-12 DIAGNOSIS — G8929 Other chronic pain: Secondary | ICD-10-CM | POA: Diagnosis not present

## 2022-11-12 DIAGNOSIS — M25562 Pain in left knee: Secondary | ICD-10-CM | POA: Diagnosis not present

## 2022-11-12 DIAGNOSIS — M25561 Pain in right knee: Secondary | ICD-10-CM | POA: Diagnosis not present

## 2022-11-12 DIAGNOSIS — M170 Bilateral primary osteoarthritis of knee: Secondary | ICD-10-CM

## 2022-11-12 NOTE — Progress Notes (Signed)
Office Visit Note   Patient: Laura Walls           Date of Birth: 14-Dec-1960           MRN: [id number] Visit Date: 11/12/2022              Requested by: No referring provider defined for this encounter. PCP: System, Provider Not In  Chief Complaint  Patient presents with   Left Knee - Pain   Right Knee - Pain      HPI: Patient is a 62 year old woman who presents with osteoarthritis both knees.  Patient has had temporary relief from a previous steroid injection December last year.  Assessment & Plan: Visit Diagnoses:  1. Chronic pain of both knees   2. Bilateral primary osteoarthritis of knee     Plan: Both knees were injected she tolerated this well will request authorization for gel injections.  Depending on the patient's response to the gel injection she may like to consider total knee arthroplasty.  Follow-Up Instructions: No follow-ups on file.   Ortho Exam  Patient is alert, oriented, no adenopathy, well-dressed, normal affect, normal respiratory effort. Examination patient has an antalgic gait.  She has tenderness to palpation both knees primarily medial joint line crepitation with range of motion of both knees collaterals are cruciates are stable.  Imaging: No results found. No images are attached to the encounter.  Labs: Lab Results  Component Value Date   HGBA1C 5.0 08/30/2021   ESRSEDRATE 39 07/02/2019   LABURIC 7.9 (H) 09/14/2022   LABURIC 6.9 08/30/2021   LABURIC 6.8 08/22/2020     Lab Results  Component Value Date   ALBUMIN 4.0 09/14/2022   ALBUMIN 4.1 08/30/2021   ALBUMIN 4.2 08/22/2020    No results found for: "MG" Lab Results  Component Value Date   VD25OH 61.6 03/23/2020   VD25OH 20.6 (L) 01/01/2020    No results found for: "PREALBUMIN"    Latest Ref Rng & Units 09/14/2022    8:41 AM 08/30/2021    8:58 AM 08/26/2021    5:11 PM  CBC EXTENDED  WBC 3.4 - 10.8 x10E3/uL 7.3  8.0  10.1   RBC 3.77 - 5.28 x10E6/uL 4.66  4.65  4.32    Hemoglobin 11.1 - 15.9 g/dL 40.9  81.1  91.4   HCT 34.0 - 46.6 % 38.8  38.6  35.2   Platelets 150 - 450 x10E3/uL 405  431  437   NEUT# 1.4 - 7.0 x10E3/uL 3.4  5.5    Lymph# 0.7 - 3.1 x10E3/uL 3.1  2.3       There is no height or weight on file to calculate BMI.  Orders:  No orders of the defined types were placed in this encounter.  No orders of the defined types were placed in this encounter.    Procedures: Large Joint Inj: bilateral knee on 11/12/2022 4:21 PM Indications: pain and diagnostic evaluation Details: 22 G 1.5 in needle, anteromedial approach  Arthrogram: No  Outcome: tolerated well, no immediate complications Procedure, treatment alternatives, risks and benefits explained, specific risks discussed. Consent was given by the patient. Immediately prior to procedure a time out was called to verify the correct patient, procedure, equipment, support staff and site/side marked as required. Patient was prepped and draped in the usual sterile fashion.      Clinical Data: No additional findings.  ROS:  All other systems negative, except as noted in the HPI. Review of Systems  Objective: Vital Signs:  There were no vitals taken for this visit.  Specialty Comments:  No specialty comments available.  PMFS History: Patient Active Problem List   Diagnosis Date Noted   Lymphedema 11/24/2021   Vitamin D deficiency 01/13/2020   Hyperlipidemia 01/22/2019   Hypothyroid 01/22/2019   Breast cancer screening by mammogram 01/22/2019   Essential hypertension 01/22/2019   Acute pain of left knee 01/22/2019   Hematuria 01/22/2019   Class 2 severe obesity due to excess calories with serious comorbidity and body mass index (BMI) of 36.0 to 36.9 in adult [facility]) 01/22/2019   Lower extremity edema 01/22/2019   Family history of colon cancer in mother 01/22/2019   Past Medical History:  Diagnosis Date   Carpal tunnel syndrome    both wrist   Hormone disorder    Hypertension     Thyroid disease     Family History  Problem Relation Age of Onset   Colon cancer Mother    Leukemia Father    Breast cancer Neg Hx    Bladder Cancer Neg Hx    Kidney cancer Neg Hx     Past Surgical History:  Procedure Laterality Date   ABDOMINAL HYSTERECTOMY     APPENDECTOMY     CARPAL TUNNEL RELEASE     CESAREAN SECTION     HEEL SPUR EXCISION     Social History   Occupational History   Not on file  Tobacco Use   Smoking status: Never   Smokeless tobacco: Never  Vaping Use   Vaping status: Never Used  Substance and Sexual Activity   Alcohol use: Not Currently   Drug use: Never   Sexual activity: Not Currently

## 2022-11-12 NOTE — Telephone Encounter (Signed)
Auth for bilateral knee visco inj, please

## 2022-11-14 NOTE — Telephone Encounter (Signed)
VOB submitted for Monovisc, bilateral knee  

## 2022-11-30 ENCOUNTER — Telehealth: Payer: Self-pay

## 2022-11-30 NOTE — Telephone Encounter (Signed)
Faxed completed PA form to [employer] at [phone] for Monovisc, bilateral knee. PA pending 

## 2022-12-14 ENCOUNTER — Telehealth: Payer: Self-pay

## 2022-12-14 NOTE — Telephone Encounter (Signed)
PA currently still pending for Monovisc, bilateral knee.

## 2023-01-03 ENCOUNTER — Other Ambulatory Visit: Payer: Self-pay

## 2023-01-03 DIAGNOSIS — E7849 Other hyperlipidemia: Secondary | ICD-10-CM

## 2023-01-03 MED ORDER — ATORVASTATIN CALCIUM 10 MG PO TABS
10.0000 mg | ORAL_TABLET | Freq: Every day | ORAL | 3 refills | Status: DC
Start: 2023-01-03 — End: 2023-01-16

## 2023-01-16 ENCOUNTER — Other Ambulatory Visit: Payer: Self-pay

## 2023-01-16 DIAGNOSIS — E7849 Other hyperlipidemia: Secondary | ICD-10-CM

## 2023-01-16 MED ORDER — ATORVASTATIN CALCIUM 10 MG PO TABS
10.0000 mg | ORAL_TABLET | Freq: Every day | ORAL | 3 refills | Status: DC
Start: 2023-01-16 — End: 2023-02-04

## 2023-01-16 MED ORDER — LEVOTHYROXINE SODIUM 150 MCG PO TABS: 150.0000 ug | ORAL_TABLET | Freq: Every day | ORAL | 3 refills | Status: DC

## 2023-01-31 ENCOUNTER — Other Ambulatory Visit: Payer: Self-pay

## 2023-01-31 DIAGNOSIS — E039 Hypothyroidism, unspecified: Secondary | ICD-10-CM

## 2023-01-31 NOTE — Progress Notes (Signed)
Pt presents today for recheck of lipids and tsh.

## 2023-02-01 LAB — LIPID PANEL
Chol/HDL Ratio: 6.3 {ratio} — ABNORMAL HIGH (ref 0.0–4.4)
Cholesterol, Total: 247 mg/dL — ABNORMAL HIGH (ref 100–199)
HDL: 39 mg/dL — ABNORMAL LOW (ref >39)
LDL Chol Calc (NIH): 185 mg/dL — ABNORMAL HIGH (ref 0–99)
Triglycerides: 127 mg/dL (ref 0–149)
VLDL Cholesterol Cal: 23 mg/dL (ref 5–40)

## 2023-02-01 LAB — THYROID PANEL WITH TSH
Free Thyroxine Index: 2.7 (ref 1.2–4.9)
T3 Uptake Ratio: 28 % (ref 24–39)
T4, Total: 9.6 ug/dL (ref 4.5–12.0)
TSH: 4.27 u[IU]/mL (ref 0.450–4.500)

## 2023-02-04 ENCOUNTER — Other Ambulatory Visit: Payer: Self-pay

## 2023-02-04 ENCOUNTER — Ambulatory Visit (INDEPENDENT_AMBULATORY_CARE_PROVIDER_SITE_OTHER): Payer: 59 | Admitting: Orthopedic Surgery

## 2023-02-04 DIAGNOSIS — M25562 Pain in left knee: Secondary | ICD-10-CM

## 2023-02-04 DIAGNOSIS — M170 Bilateral primary osteoarthritis of knee: Secondary | ICD-10-CM

## 2023-02-04 DIAGNOSIS — E7849 Other hyperlipidemia: Secondary | ICD-10-CM

## 2023-02-04 DIAGNOSIS — M25561 Pain in right knee: Secondary | ICD-10-CM

## 2023-02-04 DIAGNOSIS — G8929 Other chronic pain: Secondary | ICD-10-CM

## 2023-02-04 MED ORDER — ATORVASTATIN CALCIUM 10 MG PO TABS: 10.0000 mg | ORAL_TABLET | Freq: Every day | ORAL | 3 refills | Status: DC

## 2023-02-05 ENCOUNTER — Encounter: Payer: Self-pay | Admitting: Orthopedic Surgery

## 2023-02-05 DIAGNOSIS — M170 Bilateral primary osteoarthritis of knee: Secondary | ICD-10-CM | POA: Diagnosis not present

## 2023-02-05 MED ORDER — HYALURONAN 88 MG/4ML IX SOSY
88.0000 mg | PREFILLED_SYRINGE | INTRA_ARTICULAR | Status: AC | PRN
Start: 2023-02-05 — End: 2023-02-05
  Administered 2023-02-05: 88 mg via INTRA_ARTICULAR

## 2023-02-05 NOTE — Progress Notes (Signed)
Office Visit Note   Patient: Laura Walls           Date of Birth: 1960/06/20           MRN: [id number] Visit Date: 02/04/2023              Requested by: No referring provider defined for this encounter. PCP: System, Provider Not In  Chief Complaint  Patient presents with   Right Knee - Follow-up     Bilateral monovisc injection   Left Knee - Follow-up      HPI: Patient is a 62 year old woman with osteoarthritis both knees.  Patient states she is trying to put off knee replacement as long as she can.  Patient has pain with activities of daily living.  Assessment & Plan: Visit Diagnoses:  1. Chronic pain of both knees   2. Bilateral primary osteoarthritis of knee     Plan: Both knees were injected with Monovisc.  She will follow-up as needed.  Follow-Up Instructions: No follow-ups on file.   Ortho Exam  Patient is alert, oriented, no adenopathy, well-dressed, normal affect, normal respiratory effort. Examination of both knees there is crepitation with range of motion.  Collaterals and cruciates are stable.  There is no effusion.  Imaging: No results found. No images are attached to the encounter.  Labs: Lab Results  Component Value Date   HGBA1C 5.0 08/30/2021   ESRSEDRATE 39 07/02/2019   LABURIC 7.9 (H) 09/14/2022   LABURIC 6.9 08/30/2021   LABURIC 6.8 08/22/2020     Lab Results  Component Value Date   ALBUMIN 4.0 09/14/2022   ALBUMIN 4.1 08/30/2021   ALBUMIN 4.2 08/22/2020    No results found for: "MG" Lab Results  Component Value Date   VD25OH 61.6 03/23/2020   VD25OH 20.6 (L) 01/01/2020    No results found for: "PREALBUMIN"    Latest Ref Rng & Units 09/14/2022    8:41 AM 08/30/2021    8:58 AM 08/26/2021    5:11 PM  CBC EXTENDED  WBC 3.4 - 10.8 x10E3/uL 7.3  8.0  10.1   RBC 3.77 - 5.28 x10E6/uL 4.66  4.65  4.32   Hemoglobin 11.1 - 15.9 g/dL 13.2  44.0  10.2   HCT 34.0 - 46.6 % 38.8  38.6  35.2   Platelets 150 - 450 x10E3/uL 405  431   437   NEUT# 1.4 - 7.0 x10E3/uL 3.4  5.5    Lymph# 0.7 - 3.1 x10E3/uL 3.1  2.3       There is no height or weight on file to calculate BMI.  Orders:  No orders of the defined types were placed in this encounter.  No orders of the defined types were placed in this encounter.    Procedures: Large Joint Inj: bilateral knee on 02/05/2023 9:24 AM Indications: pain and diagnostic evaluation Details: 22 G 1.5 in needle, anteromedial approach  Arthrogram: No  Medications (Right): 88 mg Hyaluronan 88 MG/4ML Medications (Left): 88 mg Hyaluronan 88 MG/4ML Outcome: tolerated well, no immediate complications Procedure, treatment alternatives, risks and benefits explained, specific risks discussed. Consent was given by the patient. Immediately prior to procedure a time out was called to verify the correct patient, procedure, equipment, support staff and site/side marked as required. Patient was prepped and draped in the usual sterile fashion.      Clinical Data: No additional findings.  ROS:  All other systems negative, except as noted in the HPI. Review of Systems  Objective: Vital  Signs: There were no vitals taken for this visit.  Specialty Comments:  No specialty comments available.  PMFS History: Patient Active Problem List   Diagnosis Date Noted   Lymphedema 11/24/2021   Vitamin D deficiency 01/13/2020   Hyperlipidemia 01/22/2019   Hypothyroid 01/22/2019   Breast cancer screening by mammogram 01/22/2019   Essential hypertension 01/22/2019   Acute pain of left knee 01/22/2019   Hematuria 01/22/2019   Class 2 severe obesity due to excess calories with serious comorbidity and body mass index (BMI) of 36.0 to 36.9 in adult [facility]) 01/22/2019   Lower extremity edema 01/22/2019   Family history of colon cancer in mother 01/22/2019   Past Medical History:  Diagnosis Date   Carpal tunnel syndrome    both wrist   Hormone disorder    Hypertension    Thyroid disease      Family History  Problem Relation Age of Onset   Colon cancer Mother    Leukemia Father    Breast cancer Neg Hx    Bladder Cancer Neg Hx    Kidney cancer Neg Hx     Past Surgical History:  Procedure Laterality Date   ABDOMINAL HYSTERECTOMY     APPENDECTOMY     CARPAL TUNNEL RELEASE     CESAREAN SECTION     HEEL SPUR EXCISION     Social History   Occupational History   Not on file  Tobacco Use   Smoking status: Never   Smokeless tobacco: Never  Vaping Use   Vaping status: Never Used  Substance and Sexual Activity   Alcohol use: Not Currently   Drug use: Never   Sexual activity: Not Currently

## 2023-02-27 DIAGNOSIS — M25511 Pain in right shoulder: Secondary | ICD-10-CM | POA: Insufficient documentation

## 2023-02-27 DIAGNOSIS — M1712 Unilateral primary osteoarthritis, left knee: Secondary | ICD-10-CM | POA: Insufficient documentation

## 2023-02-27 DIAGNOSIS — M7551 Bursitis of right shoulder: Secondary | ICD-10-CM | POA: Insufficient documentation

## 2023-02-27 DIAGNOSIS — M25561 Pain in right knee: Secondary | ICD-10-CM | POA: Insufficient documentation

## 2023-03-11 ENCOUNTER — Ambulatory Visit: Payer: PRIVATE HEALTH INSURANCE | Admitting: Orthopedic Surgery

## 2023-04-30 DIAGNOSIS — M170 Bilateral primary osteoarthritis of knee: Secondary | ICD-10-CM | POA: Insufficient documentation

## 2023-05-05 ENCOUNTER — Other Ambulatory Visit: Payer: Self-pay | Admitting: Physician Assistant

## 2023-05-05 DIAGNOSIS — I10 Essential (primary) hypertension: Secondary | ICD-10-CM

## 2023-05-06 ENCOUNTER — Ambulatory Visit
Admission: RE | Admit: 2023-05-06 | Discharge: 2023-05-06 | Disposition: A | Discharge status: Home / Self Care | Payer: 59 | Source: Ambulatory Visit | Attending: Physician Assistant | Admitting: Physician Assistant

## 2023-05-06 ENCOUNTER — Encounter: Payer: Self-pay | Admitting: Physician Assistant

## 2023-05-06 ENCOUNTER — Ambulatory Visit: Payer: Self-pay | Admitting: Physician Assistant

## 2023-05-06 ENCOUNTER — Ambulatory Visit
Admission: RE | Admit: 2023-05-06 | Discharge: 2023-05-06 | Disposition: A | Discharge status: Home / Self Care | Payer: 59 | Attending: Physician Assistant | Admitting: Physician Assistant

## 2023-05-06 VITALS — BP 138/72 | HR 76 | Resp 16 | Ht 68.0 in | Wt 246.0 lb

## 2023-05-06 DIAGNOSIS — G8929 Other chronic pain: Secondary | ICD-10-CM | POA: Diagnosis present

## 2023-05-06 DIAGNOSIS — M25511 Pain in right shoulder: Secondary | ICD-10-CM | POA: Insufficient documentation

## 2023-05-06 MED ORDER — ORPHENADRINE CITRATE ER 100 MG PO TB12: 100.0000 mg | ORAL_TABLET | Freq: Two times a day (BID) | ORAL | 0 refills | Status: DC

## 2023-05-06 MED ORDER — NAPROXEN 500 MG PO TABS
500.0000 mg | ORAL_TABLET | Freq: Two times a day (BID) | ORAL | 0 refills | Status: DC
Start: 2023-05-06 — End: 2023-07-17

## 2023-05-06 MED ORDER — DICLOFENAC SODIUM 1 % EX GEL: 2.0000 g | Freq: Four times a day (QID) | CUTANEOUS | 0 refills | Status: DC

## 2023-05-06 NOTE — Progress Notes (Signed)
   Subjective: Right shoulder pain    Patient ID: Laura Walls, female    DOB: 17-Nov-1960, 63 y.o.   MRN: [id number]  HPI Patient complaining of right upper shoulder pain for approximately 3 months.  No provocative measures patient states mild pain during the day but increases at night especially since she sleeps on his side.  Denies loss of sensation or movement.  Rates the pain as a 5/10.  Describes the pain as aching.  States no relief with over-the-counter anti-inflammatories.   Review of Systems Diabetes, hyperlipidemia, hypertension, and hypothyroidism.    Objective:   Physical Exam BP 138/72  Pulse Rate 76  Weight 246 lb (111.6 kg)  Height 5' 8 (1.727 m)  Resp 16  SpO2 97 %  Right-hand-dominant.  No obvious deformity to the right shoulder.  No ecchymosis, edema, or erythema.  Patient demonstrates full Nikkel range of motion.  Patient has moderate guarding with palpation GH joint.       Assessment & Plan: Right shoulder pain  Patient sent for right shoulder x-ray.  Patient given prescription for Voltaren  gel, Norflex , and naproxen .  Patient will follow-up in 3 days.

## 2023-05-06 NOTE — Progress Notes (Signed)
 Pt presents today with shoulder pain about 3xmonths. Pt states it hurts at night and wakes her up. Does not hurt through out the day. Aleve and Advil is not keeping the pain content.

## 2023-05-09 NOTE — Addendum Note (Signed)
Addended by: Gardner Candle on: 05/09/2023 03:58 PM   Modules accepted: Orders

## 2023-05-27 ENCOUNTER — Ambulatory Visit (INDEPENDENT_AMBULATORY_CARE_PROVIDER_SITE_OTHER): Payer: 59 | Admitting: Orthopedic Surgery

## 2023-05-27 ENCOUNTER — Encounter: Payer: Self-pay | Admitting: Orthopedic Surgery

## 2023-05-27 DIAGNOSIS — M7541 Impingement syndrome of right shoulder: Secondary | ICD-10-CM

## 2023-05-27 NOTE — Progress Notes (Signed)
Office Visit Note   Patient: Laura Walls           Date of Birth: [birth year]           MRN: [id number] Visit Date: 05/27/2023              Requested by: Joni Reining, PA-C [id number] [place],  [place] [address] PCP: Joni Reining, PA-C  Chief Complaint  Patient presents with   Right Shoulder - Pain      HPI: Patient is a 63 year old woman who is seen for evaluation for right shoulder pain.  Patient states she can no longer sleep at night due to the pain.  She has tried Voltaren gel anti-inflammatories as well as a subacromial injection without relief.  Patient received the injection in [place] in December and was told she had bursitis.  Assessment & Plan: Visit Diagnoses:  1. Impingement syndrome of right shoulder     Plan: Will obtain an MRI scan to further evaluate the integrity of the rotator cuff.  Follow-up after the MRI scan is obtained.  Follow-Up Instructions: Return if symptoms worsen or fail to improve.   Ortho Exam  Patient is alert, oriented, no adenopathy, well-dressed, normal affect, normal respiratory effort. Examination patient has full range of motion of the right shoulder.  She has pain to palpation of the biceps tendon and pain to palpation over the [facility] joint.  She has pain with Neer and Hawkins impingement test.  Patient has some pain to palpation over the volar aspect of her wrist she states she does have a history of carpal tunnel syndrome but no numbness or tingling in her fingers.  Radiographs were reviewed which shows AC arthropathy with sclerotic changes at the insertion of the rotator cuff.  Imaging: No results found. No images are attached to the encounter.  Labs: Lab Results  Component Value Date   HGBA1C 5.0 08/30/2021   ESRSEDRATE 39 07/02/2019   LABURIC 7.9 (H) 09/14/2022   LABURIC 6.9 08/30/2021   LABURIC 6.8 08/22/2020     Lab Results  Component Value Date   ALBUMIN 4.0 09/14/2022   ALBUMIN 4.1 08/30/2021    ALBUMIN 4.2 08/22/2020    No results found for: "MG" Lab Results  Component Value Date   VD25OH 61.6 03/23/2020   VD25OH 20.6 (L) 01/01/2020    No results found for: "PREALBUMIN"    Latest Ref Rng & Units 09/14/2022    8:41 AM 08/30/2021    8:58 AM 08/26/2021    5:11 PM  CBC EXTENDED  WBC 3.4 - 10.8 x10E3/uL 7.3  8.0  10.1   RBC 3.77 - 5.28 x10E6/uL 4.66  4.65  4.32   Hemoglobin 11.1 - 15.9 g/dL 32.4  40.1  02.7   HCT 34.0 - 46.6 % 38.8  38.6  35.2   Platelets 150 - 450 x10E3/uL 405  431  437   NEUT# 1.4 - 7.0 x10E3/uL 3.4  5.5    Lymph# 0.7 - 3.1 x10E3/uL 3.1  2.3       There is no height or weight on file to calculate BMI.  Orders:  Orders Placed This Encounter  Procedures   MR SHOULDER RIGHT WO CONTRAST   No orders of the defined types were placed in this encounter.    Procedures: No procedures performed  Clinical Data: No additional findings.  ROS:  All other systems negative, except as noted in the HPI. Review of Systems  Objective: Vital Signs: There were  no vitals taken for this visit.  Specialty Comments:  No specialty comments available.  PMFS History: Patient Active Problem List   Diagnosis Date Noted   Lymphedema 11/24/2021   Vitamin D deficiency 01/13/2020   Hyperlipidemia 01/22/2019   Hypothyroid 01/22/2019   Breast cancer screening by mammogram 01/22/2019   Essential hypertension 01/22/2019   Acute pain of left knee 01/22/2019   Hematuria 01/22/2019   Class 2 severe obesity due to excess calories with serious comorbidity and body mass index (BMI) of 36.0 to 36.9 in adult [facility]) 01/22/2019   Lower extremity edema 01/22/2019   Family history of colon cancer in mother 01/22/2019   Past Medical History:  Diagnosis Date   Carpal tunnel syndrome    both wrist   Hormone disorder    Hypertension    Thyroid disease     Family History  Problem Relation Age of Onset   Colon cancer Mother    Leukemia Father    Breast cancer Neg Hx     Bladder Cancer Neg Hx    Kidney cancer Neg Hx     Past Surgical History:  Procedure Laterality Date   ABDOMINAL HYSTERECTOMY     APPENDECTOMY     CARPAL TUNNEL RELEASE     CESAREAN SECTION     HEEL SPUR EXCISION     Social History   Occupational History   Not on file  Tobacco Use   Smoking status: Never   Smokeless tobacco: Never  Vaping Use   Vaping status: Never Used  Substance and Sexual Activity   Alcohol use: Not Currently   Drug use: Never   Sexual activity: Not Currently

## 2023-06-10 ENCOUNTER — Ambulatory Visit
Admission: RE | Admit: 2023-06-10 | Discharge: 2023-06-10 | Disposition: A | Discharge status: Home / Self Care | Payer: 59 | Source: Ambulatory Visit | Attending: Orthopedic Surgery | Admitting: Orthopedic Surgery

## 2023-06-10 DIAGNOSIS — M7541 Impingement syndrome of right shoulder: Secondary | ICD-10-CM

## 2023-06-18 NOTE — Progress Notes (Signed)
 Sure thing , thx - lauren can you call and have her come in this week thx

## 2023-06-19 ENCOUNTER — Telehealth: Payer: Self-pay

## 2023-06-19 NOTE — Telephone Encounter (Signed)
 IC appt scheduled. Patient unable to come this week so scheduled a time that was available for her.

## 2023-06-19 NOTE — Telephone Encounter (Signed)
-----   Message from Burnard Bunting sent at 06/18/2023  1:14 PM EST ----- Sure thing , thx - Osbaldo Mark can you call and have her come in this week thx

## 2023-06-21 ENCOUNTER — Ambulatory Visit: Payer: 59

## 2023-06-21 DIAGNOSIS — Z1211 Encounter for screening for malignant neoplasm of colon: Secondary | ICD-10-CM | POA: Diagnosis present

## 2023-06-21 DIAGNOSIS — Z800 Family history of malignant neoplasm of digestive organs: Secondary | ICD-10-CM | POA: Diagnosis not present

## 2023-06-25 ENCOUNTER — Ambulatory Visit: Payer: 59 | Admitting: Orthopedic Surgery

## 2023-06-26 ENCOUNTER — Other Ambulatory Visit (INDEPENDENT_AMBULATORY_CARE_PROVIDER_SITE_OTHER): Payer: Self-pay

## 2023-06-26 ENCOUNTER — Other Ambulatory Visit: Payer: Self-pay

## 2023-06-26 ENCOUNTER — Ambulatory Visit (INDEPENDENT_AMBULATORY_CARE_PROVIDER_SITE_OTHER): Payer: 59 | Admitting: Orthopedic Surgery

## 2023-06-26 ENCOUNTER — Telehealth: Payer: Self-pay | Admitting: Orthopedic Surgery

## 2023-06-26 DIAGNOSIS — M5412 Radiculopathy, cervical region: Secondary | ICD-10-CM | POA: Diagnosis not present

## 2023-06-26 DIAGNOSIS — M7541 Impingement syndrome of right shoulder: Secondary | ICD-10-CM

## 2023-06-26 DIAGNOSIS — M65911 Unspecified synovitis and tenosynovitis, right shoulder: Secondary | ICD-10-CM

## 2023-06-26 MED ORDER — TRAMADOL HCL 50 MG PO TABS: 50.0000 mg | ORAL_TABLET | Freq: Every evening | ORAL | 0 refills | Status: DC | PRN

## 2023-06-26 NOTE — Telephone Encounter (Signed)
 Per August Saucer called in Tramadol for patient

## 2023-06-26 NOTE — Telephone Encounter (Signed)
 Patient called. Would like something to help her sleep. Called in to [facility] on [address] in [place]. Her cb# [phone]

## 2023-06-28 NOTE — Progress Notes (Signed)
 Office Visit Note   Patient: Laura Walls           Date of Birth: 1960-07-21           MRN: [id number] Visit Date: 06/26/2023 Requested by: Joni Reining, PA-C [address] [place],  [place] [address] PCP: Wells Guiles  Subjective: Chief Complaint  Patient presents with   Right Shoulder - Follow-up    Review MRI    HPI: Laura Walls is a 63 y.o. female who presents to the office reporting right shoulder pain.  Since she was last seen she has had an MRI scan of the shoulder which is reviewed.  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  Significant superior subscap tendinosis with mid substance tear allowing the long head of the biceps tendon to be perched on the anterior superior aspect of the lesser tuberosity there is also some tendinosis and interstitial tearing of the proximal long head of the biceps tendon.  There is some marrow edema in the [facility] joint and we would have to more fully evaluate that at the next clinic visit to determine whether or not that should be included in any type of operative plan for the shoulder should that arise.  Patient states that in general her right shoulder feels worse.  She does a lot of traveling and requires luggage for management.  Takes Aleve and Advil and Voltaren.  The pain wakes her from sleep about every 2 hours.  Has some occasional neck pain but that has been of more recent phenomenon.  Pain is a bigger problem than loss of strength.  The bra strap hurts the shoulder on top.  Symptoms started 6 months ago.  Became more severe the last 3 months.  Had an injection in [place] in December which gave her only 1 or 2 days of assistance.  Has a history of carpal tunnel release but she states that her shoulder feels better and her arm feels better with her arm overhead..                ROS: All systems reviewed are negative as they relate  to the chief complaint within the history of present illness.  Patient denies fevers or chills.  Assessment & Plan: Visit Diagnoses:  1. Impingement syndrome of right shoulder   2. Radiculopathy, cervical region     Plan: Impression is right shoulder pain with definite reasons for the shoulder to hurt but they appear to be more chronic.  She is having symptoms now which indicates cervical radiculopathy including neck pain with pain radiating down to the wrist and below the elbow.  Plan at this time is tramadol for pain with ultrasound injection of the right shoulder into the glenohumeral joint for diagnostic and therapeutic purposes.  MRI of the cervical spine to evaluate right-sided radiculopathy prior to surgical intervention in the shoulder which is likely in her future.  Would like to make sure there is no radicular component to this problem which is giving her a little bit more pain than I would expect just from this chronic appearing rotator cuff tear.  Follow-up after the MRI scan.  Follow-Up Instructions: No follow-ups on file.   Orders:  Orders Placed This Encounter  Procedures   US Guided Needle Placement - No Linked Charges   XR Cervical Spine 2 or 3 views   MR Cervical Spine w/o contrast  No orders of the defined types were placed in this encounter.     Procedures: Large Joint Inj: R glenohumeral on 06/26/2023 7:54 AM Indications: diagnostic evaluation and pain Details: 22 G 3.5 in needle, ultrasound-guided posterior approach  Arthrogram: No  Medications: 9 mL bupivacaine 0.5 %; 40 mg methylPREDNISolone acetate 40 MG/ML; 5 mL lidocaine 1 % Outcome: tolerated well, no immediate complications Procedure, treatment alternatives, risks and benefits explained, specific risks discussed. Consent was given by the patient. Immediately prior to procedure a time out was called to verify the correct patient, procedure, equipment, support staff and site/side marked as required. Patient  was prepped and draped in the usual sterile fashion.       Clinical Data: No additional findings.  Objective: Vital Signs: There were no vitals taken for this visit.  Physical Exam:  Constitutional: Patient appears well-developed HEENT:  Head: Normocephalic Eyes:EOM are normal Neck: Normal range of motion Cardiovascular: Normal rate Pulmonary/chest: Effort normal Neurologic: Patient is alert Skin: Skin is warm Psychiatric: Patient has normal mood and affect  Ortho Exam: Ortho exam demonstrates slight weakness to external rotation on the right compared to the left motor testing.  Subscap strength 5+ out of 5 bilaterally but more painful on the right-hand side than the left.  Does have positive O'Brien's testing and positive bicipital groove tenderness.  Motor or sensory function to the shoulder and hand otherwise intact.  Mild paresthesias in the C6 distribution on the right.  Radial pulse intact bilaterally.  No weakness with bicep triceps or deltoid testing bilaterally.  Does have slightly diminished and painful cervical spine range of motion particularly with rotation to the right.  No scapular dyskinesia with forward flexion.  Specialty Comments:  No specialty comments available.  Imaging: No results found.   PMFS History: Patient Active Problem List   Diagnosis Date Noted   Lymphedema 11/24/2021   Vitamin D deficiency 01/13/2020   Hyperlipidemia 01/22/2019   Hypothyroid 01/22/2019   Breast cancer screening by mammogram 01/22/2019   Essential hypertension 01/22/2019   Acute pain of left knee 01/22/2019   Hematuria 01/22/2019   Class 2 severe obesity due to excess calories with serious comorbidity and body mass index (BMI) of 36.0 to 36.9 in adult [facility]) 01/22/2019   Lower extremity edema 01/22/2019   Family history of colon cancer in mother 01/22/2019   Past Medical History:  Diagnosis Date   Carpal tunnel syndrome    both wrist   Hormone disorder     Hypertension    Thyroid disease     Family History  Problem Relation Age of Onset   Colon cancer Mother    Leukemia Father    Breast cancer Neg Hx    Bladder Cancer Neg Hx    Kidney cancer Neg Hx     Past Surgical History:  Procedure Laterality Date   ABDOMINAL HYSTERECTOMY     APPENDECTOMY     CARPAL TUNNEL RELEASE     CESAREAN SECTION     HEEL SPUR EXCISION     Social History   Occupational History   Not on file  Tobacco Use   Smoking status: Never   Smokeless tobacco: Never  Vaping Use   Vaping status: Never Used  Substance and Sexual Activity   Alcohol use: Not Currently   Drug use: Never   Sexual activity: Not Currently

## 2023-06-30 ENCOUNTER — Encounter: Payer: Self-pay | Admitting: Orthopedic Surgery

## 2023-06-30 MED ORDER — BUPIVACAINE HCL 0.5 % IJ SOLN
9.0000 mL | INTRAMUSCULAR | Status: AC | PRN
  Administered 2023-06-26: 9 mL via INTRA_ARTICULAR

## 2023-06-30 MED ORDER — METHYLPREDNISOLONE ACETATE 40 MG/ML IJ SUSP
40.0000 mg | INTRAMUSCULAR | Status: AC | PRN
  Administered 2023-06-26: 40 mg via INTRA_ARTICULAR

## 2023-06-30 MED ORDER — LIDOCAINE HCL 1 % IJ SOLN
5.0000 mL | INTRAMUSCULAR | Status: AC | PRN
  Administered 2023-06-26: 5 mL

## 2023-07-09 ENCOUNTER — Ambulatory Visit: Admit: 2023-07-09 | Payer: 59 | Admitting: Ophthalmology

## 2023-07-09 SURGERY — CATARACT EXTRACTION PHACO AND INTRAOCULAR LENS PLACEMENT (IOC)
Anesthesia: Topical | Laterality: Right

## 2023-07-10 ENCOUNTER — Ambulatory Visit
Admission: RE | Admit: 2023-07-10 | Discharge: 2023-07-10 | Disposition: A | Discharge status: Home / Self Care | Source: Ambulatory Visit | Attending: Orthopedic Surgery | Admitting: Orthopedic Surgery

## 2023-07-10 DIAGNOSIS — M5412 Radiculopathy, cervical region: Secondary | ICD-10-CM

## 2023-07-17 ENCOUNTER — Ambulatory Visit (INDEPENDENT_AMBULATORY_CARE_PROVIDER_SITE_OTHER): Admitting: Surgical

## 2023-07-17 DIAGNOSIS — M5412 Radiculopathy, cervical region: Secondary | ICD-10-CM | POA: Diagnosis not present

## 2023-07-17 MED ORDER — GABAPENTIN 100 MG PO CAPS: 200.0000 mg | ORAL_CAPSULE | Freq: Every day | ORAL | 0 refills | Status: DC

## 2023-07-17 MED ORDER — MELOXICAM 15 MG PO TABS: 15.0000 mg | ORAL_TABLET | Freq: Every day | ORAL | 0 refills | Status: DC

## 2023-07-17 NOTE — Progress Notes (Signed)
 Office Visit Note   Patient: Laura Walls           Date of Birth: 1961/04/15           MRN: [id number] Visit Date: 07/17/2023 Requested by: Joni Reining, PA-C [address] [place],  [place] [address] PCP: Joni Reining, PA-C  Subjective: Chief Complaint  Patient presents with   Right Shoulder - Pain    Advising injection lasted aprox 1 week No recent falls or injuries Works as a Emergency planning/management officer     HPI: Laura Walls is a 63 y.o. female who presents to the office for MRI review. Patient denies any changes in symptoms.  Continues to complain mainly of right shoulder pain.  She localizes pain to the anterior and superior aspects of the shoulder with radicular symptoms that extend down to her fingers both in the morning and a few times throughout the day.  She has no numbness or tingling otherwise.  Does feel like her arm is weaker at times.  No scapular or neck pain.  Takes Aleve.  Had right glenohumeral injection on 06/26/2023 that gave her about 1 day of somewhere between 50 to 80% relief of her shoulder pain.  This relief resolved over about a week.  She works as a Emergency planning/management officer.              ROS: All systems reviewed are negative as they relate to the chief complaint within the history of present illness.  Patient denies fevers or chills.  Assessment & Plan: Visit Diagnoses:  1. Radiculopathy, cervical region     Plan: Laura Walls is a 63 y.o. female who presents to the office for review of cervical spine MRI.  MRI has not been read by radiologist but interpretation per myself shows MRI does demonstrate disc bulge at multiple levels including C3-C4, C4-C5, C5-C6, C6-C7.  There is right-sided foraminal stenosis at multiple levels rated mild to moderate at C3-C4, mild at C4-C5, moderate at C5-C6 with far lateral right-sided disc protrusion.  She did get good relief from right glenohumeral injection of about 50 to 80% of her right shoulder symptoms but with the  continued presence of her pain and the radicular nature of her pain at times, especially in the morning, we will try cervical spine ESI with Dr. Alvester Morin to see if this is more or less helpful than the shoulder injection was.  Also prescribed Mobic to take instead of Aleve and gabapentin to take as needed at night.  Hopefully this will help with her sleeping.  Follow-up after cervical spine ESI for discussion of options with Dr. August Saucer based on her response.  Follow-Up Instructions: No follow-ups on file.   Orders:  Orders Placed This Encounter  Procedures   Ambulatory referral to Physical Medicine Rehab   Meds ordered this encounter  Medications   meloxicam (MOBIC) 15 MG tablet    Sig: Take 1 tablet (15 mg total) by mouth daily with breakfast.    Dispense:  30 tablet    Refill:  0   gabapentin (NEURONTIN) 100 MG capsule    Sig: Take 2 capsules (200 mg total) by mouth at bedtime.    Dispense:  30 capsule    Refill:  0      Procedures: No procedures performed   Clinical Data: No additional findings.  Objective: Vital Signs: There were no vitals taken for this visit.  Physical Exam:  Constitutional: Patient appears well-developed HEENT:  Head: Normocephalic Eyes:EOM are  normal Neck: Normal range of motion Cardiovascular: Normal rate Pulmonary/chest: Effort normal Neurologic: Patient is alert Skin: Skin is warm Psychiatric: Patient has normal mood and affect  Ortho Exam: Ortho exam demonstrates 2+ radial pulse.  Axillary nerve intact with deltoid firing.  Intact EPL, FPL, finger abduction.  Specialty Comments:  No specialty comments available.  Imaging: No results found.   PMFS History: Patient Active Problem List   Diagnosis Date Noted   Lymphedema 11/24/2021   Vitamin D deficiency 01/13/2020   Hyperlipidemia 01/22/2019   Hypothyroid 01/22/2019   Breast cancer screening by mammogram 01/22/2019   Essential hypertension 01/22/2019   Acute pain of left knee  01/22/2019   Hematuria 01/22/2019   Class 2 severe obesity due to excess calories with serious comorbidity and body mass index (BMI) of 36.0 to 36.9 in adult [facility]) 01/22/2019   Lower extremity edema 01/22/2019   Family history of colon cancer in mother 01/22/2019   Past Medical History:  Diagnosis Date   Carpal tunnel syndrome    both wrist   Hormone disorder    Hypertension    Thyroid disease     Family History  Problem Relation Age of Onset   Colon cancer Mother    Leukemia Father    Breast cancer Neg Hx    Bladder Cancer Neg Hx    Kidney cancer Neg Hx     Past Surgical History:  Procedure Laterality Date   ABDOMINAL HYSTERECTOMY     APPENDECTOMY     CARPAL TUNNEL RELEASE     CESAREAN SECTION     HEEL SPUR EXCISION     Social History   Occupational History   Not on file  Tobacco Use   Smoking status: Never   Smokeless tobacco: Never  Vaping Use   Vaping status: Never Used  Substance and Sexual Activity   Alcohol use: Not Currently   Drug use: Never   Sexual activity: Not Currently

## 2023-07-18 ENCOUNTER — Other Ambulatory Visit: Payer: Self-pay | Admitting: Physician Assistant

## 2023-07-18 DIAGNOSIS — Z1231 Encounter for screening mammogram for malignant neoplasm of breast: Secondary | ICD-10-CM

## 2023-07-22 NOTE — Addendum Note (Signed)
 Addended by: Rogers Seeds on: 07/22/2023 08:28 AM   Modules accepted: Orders

## 2023-07-23 ENCOUNTER — Ambulatory Visit: Admit: 2023-07-23 | Payer: 59 | Admitting: Ophthalmology

## 2023-07-23 SURGERY — CATARACT EXTRACTION PHACO AND INTRAOCULAR LENS PLACEMENT (IOC)
Anesthesia: Topical | Laterality: Left

## 2023-08-07 ENCOUNTER — Ambulatory Visit
Admission: RE | Admit: 2023-08-07 | Discharge: 2023-08-07 | Disposition: A | Discharge status: Home / Self Care | Payer: PRIVATE HEALTH INSURANCE | Source: Ambulatory Visit | Attending: Physician Assistant | Admitting: Physician Assistant

## 2023-08-07 DIAGNOSIS — Z1231 Encounter for screening mammogram for malignant neoplasm of breast: Secondary | ICD-10-CM | POA: Insufficient documentation

## 2023-08-08 ENCOUNTER — Telehealth: Payer: Self-pay

## 2023-08-08 NOTE — Telephone Encounter (Signed)
 Needs appt

## 2023-08-08 NOTE — Telephone Encounter (Signed)
-----   Message from Marykay Snipes sent at 08/08/2023 12:55 PM EDT ----- Needs follow-up.  Please arrange.  Thanks

## 2023-08-08 NOTE — Progress Notes (Signed)
 Needs follow-up.  Please arrange.  Thanks

## 2023-08-12 ENCOUNTER — Other Ambulatory Visit: Payer: Self-pay

## 2023-08-12 ENCOUNTER — Telehealth: Payer: Self-pay

## 2023-08-12 NOTE — Telephone Encounter (Signed)
 Error

## 2023-08-15 ENCOUNTER — Telehealth: Payer: Self-pay | Admitting: Physical Medicine and Rehabilitation

## 2023-08-15 NOTE — Telephone Encounter (Signed)
 Patient called and said she needs to cancel 08/21/2023 appointment and reschedule for after the 9th of May. [phone]

## 2023-08-21 ENCOUNTER — Encounter: Payer: PRIVATE HEALTH INSURANCE | Admitting: Physical Medicine and Rehabilitation

## 2023-08-30 ENCOUNTER — Encounter: Payer: Self-pay | Admitting: Orthopedic Surgery

## 2023-08-30 ENCOUNTER — Ambulatory Visit: Payer: PRIVATE HEALTH INSURANCE | Admitting: Orthopedic Surgery

## 2023-08-30 ENCOUNTER — Other Ambulatory Visit: Payer: Self-pay | Admitting: Orthopedic Surgery

## 2023-08-30 DIAGNOSIS — M5412 Radiculopathy, cervical region: Secondary | ICD-10-CM | POA: Diagnosis not present

## 2023-08-30 MED ORDER — GABAPENTIN 100 MG PO CAPS: 100.0000 mg | ORAL_CAPSULE | Freq: Three times a day (TID) | ORAL | 0 refills | Status: DC

## 2023-08-30 MED ORDER — TRAMADOL HCL 50 MG PO TABS: 50.0000 mg | ORAL_TABLET | Freq: Two times a day (BID) | ORAL | 0 refills | Status: DC | PRN

## 2023-08-30 NOTE — Progress Notes (Unsigned)
 Office Visit Note   Patient: Laura Walls           Date of Birth: 1960-12-28           MRN: [id number] Visit Date: 08/30/2023 Requested by: Marcina Severe, PA-C [address] [place],  [place] [address] PCP: Marcina Severe, PA-C  Subjective: Chief Complaint  Patient presents with   Other    Scan review discuss neck/arm pain    HPI: Laura Walls is a 63 y.o. female who presents to the office reporting continued pain in the right neck and arm region.  Since she was last seen she has had a cervical spine ESI requested but has not been done yet.  Symptoms do not radiate below the elbow.  Better but not as much lately.  Shoulder injection not helpful.  Patient does have severe bilateral foraminal narrowing at C4-5..                ROS: All systems reviewed are negative as they relate to the chief complaint within the history of present illness.  Patient denies fevers or chills.  Assessment & Plan: Visit Diagnoses:  1. Radiculopathy, cervical region     Plan: Impression is cervical radiculopathy.  Tramadol , [place] refilled.  She has good strength and range of motion in both shoulders.  Plan is to see how she does with cervical spine ESI.  If she gets short-term relief but then symptoms recur we would need to consider surgical referral.  Follow-Up Instructions: No follow-ups on file.   Orders:  No orders of the defined types were placed in this encounter.  No orders of the defined types were placed in this encounter.     Procedures: No procedures performed   Clinical Data: No additional findings.  Objective: Vital Signs: There were no vitals taken for this visit.  Physical Exam:  Constitutional: Patient appears well-developed HEENT:  Head: Normocephalic Eyes:EOM are normal Neck: Normal range of motion Cardiovascular: Normal rate Pulmonary/chest: Effort normal Neurologic: Patient is alert Skin: Skin is warm Psychiatric: Patient has normal mood and  affect  Ortho Exam: Ortho exam demonstrates pretty reasonable cervical spine range of motion.  5 out of 5 grip EPL FPL interosseous wrist flexion extension bicep tricep and deltoid strength.  She has good strength in her shoulders to internal and external rotation at 15 degrees of abduction.  Passive range of motion bilaterally is 60/100/165.  Specialty Comments:  No specialty comments available.  Imaging: No results found.   PMFS History: Patient Active Problem List   Diagnosis Date Noted   Osteoarthritis of both knees 04/30/2023   Arthralgia of both knees 02/27/2023   Bursitis of right shoulder 02/27/2023   Osteoarthritis of left knee 02/27/2023   Pain of both shoulder joints 02/27/2023   Lymphedema 11/24/2021   Vitamin D  deficiency 01/13/2020   Hyperlipidemia 01/22/2019   Hypothyroid 01/22/2019   Breast cancer screening by mammogram 01/22/2019   Essential hypertension 01/22/2019   Acute pain of left knee 01/22/2019   Hematuria 01/22/2019   Class 2 severe obesity due to excess calories with serious comorbidity and body mass index (BMI) of 36.0 to 36.9 in adult [facility]) 01/22/2019   Lower extremity edema 01/22/2019   Family history of colon cancer in mother 01/22/2019   Past Medical History:  Diagnosis Date   Carpal tunnel syndrome    both wrist   Hormone disorder    Hypertension    Thyroid  disease     Family History  Problem Relation Age of Onset   Colon cancer Mother    Leukemia Father    Breast cancer Neg Hx    Bladder Cancer Neg Hx    Kidney cancer Neg Hx     Past Surgical History:  Procedure Laterality Date   ABDOMINAL HYSTERECTOMY     APPENDECTOMY     CARPAL TUNNEL RELEASE     CESAREAN SECTION     HEEL SPUR EXCISION     Social History   Occupational History   Not on file  Tobacco Use   Smoking status: Never   Smokeless tobacco: Never  Vaping Use   Vaping status: Never Used  Substance and Sexual Activity   Alcohol use: Not Currently   Drug use:  Never   Sexual activity: Not Currently

## 2023-08-30 NOTE — Progress Notes (Unsigned)
 Office Visit Note   Patient: Laura Walls           Date of Birth: 1961-01-09           MRN: [id number] Visit Date: 08/30/2023 Requested by: No referring provider defined for this encounter. PCP: Marcina Severe, PA-C  Subjective: No chief complaint on file.   HPI: Laura Walls is a 63 y.o. female who presents to the office reporting ***.                ROS: All systems reviewed are negative as they relate to the chief complaint within the history of present illness.  Patient denies fevers or chills.  Assessment & Plan: Visit Diagnoses: No diagnosis found.  Plan: ***  Follow-Up Instructions: No follow-ups on file.   Orders:  No orders of the defined types were placed in this encounter.  Meds ordered this encounter  Medications   traMADol  (ULTRAM ) 50 MG tablet    Sig: Take 1 tablet (50 mg total) by mouth every 12 (twelve) hours as needed.    Dispense:  30 tablet    Refill:  0   gabapentin  (NEURONTIN ) 100 MG capsule    Sig: Take 1 capsule (100 mg total) by mouth 3 (three) times daily.    Dispense:  60 capsule    Refill:  0      Procedures: No procedures performed   Clinical Data: No additional findings.  Objective: Vital Signs: There were no vitals taken for this visit.  Physical Exam:  Constitutional: Patient appears well-developed HEENT:  Head: Normocephalic Eyes:EOM are normal Neck: Normal range of motion Cardiovascular: Normal rate Pulmonary/chest: Effort normal Neurologic: Patient is alert Skin: Skin is warm Psychiatric: Patient has normal mood and affect  Ortho Exam: ***  Specialty Comments:  No specialty comments available.  Imaging: No results found.   PMFS History: Patient Active Problem List   Diagnosis Date Noted   Osteoarthritis of both knees 04/30/2023   Arthralgia of both knees 02/27/2023   Bursitis of right shoulder 02/27/2023   Osteoarthritis of left knee 02/27/2023   Pain of both shoulder joints 02/27/2023    Lymphedema 11/24/2021   Vitamin D  deficiency 01/13/2020   Hyperlipidemia 01/22/2019   Hypothyroid 01/22/2019   Breast cancer screening by mammogram 01/22/2019   Essential hypertension 01/22/2019   Acute pain of left knee 01/22/2019   Hematuria 01/22/2019   Class 2 severe obesity due to excess calories with serious comorbidity and body mass index (BMI) of 36.0 to 36.9 in adult [facility]) 01/22/2019   Lower extremity edema 01/22/2019   Family history of colon cancer in mother 01/22/2019   Past Medical History:  Diagnosis Date   Carpal tunnel syndrome    both wrist   Hormone disorder    Hypertension    Thyroid  disease     Family History  Problem Relation Age of Onset   Colon cancer Mother    Leukemia Father    Breast cancer Neg Hx    Bladder Cancer Neg Hx    Kidney cancer Neg Hx     Past Surgical History:  Procedure Laterality Date   ABDOMINAL HYSTERECTOMY     APPENDECTOMY     CARPAL TUNNEL RELEASE     CESAREAN SECTION     HEEL SPUR EXCISION     Social History   Occupational History   Not on file  Tobacco Use   Smoking status: Never   Smokeless tobacco: Never  Vaping Use   Vaping  status: Never Used  Substance and Sexual Activity   Alcohol use: Not Currently   Drug use: Never   Sexual activity: Not Currently

## 2023-09-03 ENCOUNTER — Encounter: Payer: PRIVATE HEALTH INSURANCE | Admitting: Physical Medicine and Rehabilitation

## 2023-09-04 ENCOUNTER — Ambulatory Visit
Admission: RE | Admit: 2023-09-04 | Discharge: 2023-09-04 | Disposition: A | Discharge status: Home / Self Care | Payer: PRIVATE HEALTH INSURANCE | Source: Ambulatory Visit | Attending: Emergency Medicine | Admitting: Emergency Medicine

## 2023-09-04 VITALS — BP 113/73 | HR 78 | Temp 99.0°F | Resp 18 | Ht 68.0 in | Wt 243.0 lb

## 2023-09-04 DIAGNOSIS — R252 Cramp and spasm: Secondary | ICD-10-CM | POA: Diagnosis not present

## 2023-09-04 MED ORDER — BACLOFEN 5 MG PO TABS: 5.0000 mg | ORAL_TABLET | Freq: Every evening | ORAL | 0 refills | Status: DC | PRN

## 2023-09-04 NOTE — ED Triage Notes (Signed)
 Patient states intermittent bilateral calf pain for about 1 week, no SOB or other symptoms

## 2023-09-04 NOTE — ED Provider Notes (Signed)
 Laura Walls    CSN: [id number] Arrival date & time: 09/04/23  1801      History   Chief Complaint Chief Complaint  Patient presents with   Leg Pain    Tightness in thigh and want to rule out blood clot. - Entered by patient    HPI Laura Walls is a 63 y.o. female.   Patient presents for evaluation of intermittent bilateral cramping to the calf muscles present for 1 week.  Occurring sporadically without precipitating event.  Denies injury or trauma, numbness tingling.  Endorses lateral ankle swelling present for at least 2 years, has noticed mild discoloration to the left ankle beginning 1 year ago into the right ankle 3 to 4 months ago.  Has been evaluated by PCP who deemed stable.  Denies changes in diet, medication.  Past Medical History:  Diagnosis Date   Carpal tunnel syndrome    both wrist   Hormone disorder    Hypertension    Thyroid  disease     Patient Active Problem List   Diagnosis Date Noted   Osteoarthritis of both knees 04/30/2023   Arthralgia of both knees 02/27/2023   Bursitis of right shoulder 02/27/2023   Osteoarthritis of left knee 02/27/2023   Pain of both shoulder joints 02/27/2023   Lymphedema 11/24/2021   Vitamin D  deficiency 01/13/2020   Hyperlipidemia 01/22/2019   Hypothyroid 01/22/2019   Breast cancer screening by mammogram 01/22/2019   Essential hypertension 01/22/2019   Acute pain of left knee 01/22/2019   Hematuria 01/22/2019   Class 2 severe obesity due to excess calories with serious comorbidity and body mass index (BMI) of 36.0 to 36.9 in adult [facility]) 01/22/2019   Lower extremity edema 01/22/2019   Family history of colon cancer in mother 01/22/2019    Past Surgical History:  Procedure Laterality Date   ABDOMINAL HYSTERECTOMY     APPENDECTOMY     CARPAL TUNNEL RELEASE     CESAREAN SECTION     HEEL SPUR EXCISION      OB History     Gravida  5   Para  1   Term  1   Preterm      AB  4   Living  1       SAB  4   IAB      Ectopic      Multiple      Live Births  1            Home Medications    Prior to Admission medications   Medication Sig Start Date End Date Taking? Authorizing Provider  Baclofen 5 MG TABS Take 1 tablet (5 mg total) by mouth at bedtime as needed. 09/04/23  Yes Hoover Grewe R, NP  atorvastatin  (LIPITOR) 10 MG tablet Take 1 tablet (10 mg total) by mouth daily. 02/04/23   Marcina Severe, PA-C  gabapentin  (NEURONTIN ) 100 MG capsule Take 2 capsules (200 mg total) by mouth at bedtime. 07/17/23   Magnant, Justice Olp, PA-C  gabapentin  (NEURONTIN ) 100 MG capsule Take 1 capsule (100 mg total) by mouth 3 (three) times daily. 08/30/23   Jasmine Mesi, MD  hydrochlorothiazide  (HYDRODIURIL ) 25 MG tablet TAKE 1 TABLET (25 MG TOTAL) BY MOUTH DAILY. 05/06/23   Marcina Severe, PA-C  levothyroxine  (SYNTHROID ) 150 MCG tablet Take 1 tablet (150 mcg total) by mouth daily. Follow up labs in 30 days. 01/16/23   Marcina Severe, PA-C  meloxicam  (MOBIC ) 15 MG tablet Take 1  tablet (15 mg total) by mouth daily with breakfast. 07/17/23   Magnant, Justice Olp, PA-C  traMADol  (ULTRAM ) 50 MG tablet Take 1 tablet (50 mg total) by mouth at bedtime as needed. 06/26/23   Jasmine Mesi, MD  traMADol  (ULTRAM ) 50 MG tablet Take 1 tablet (50 mg total) by mouth every 12 (twelve) hours as needed. 08/30/23   Jasmine Mesi, MD    Family History Family History  Problem Relation Age of Onset   Colon cancer Mother    Leukemia Father    Breast cancer Neg Hx    Bladder Cancer Neg Hx    Kidney cancer Neg Hx     Social History Social History   Tobacco Use   Smoking status: Never   Smokeless tobacco: Never  Vaping Use   Vaping status: Never Used  Substance Use Topics   Alcohol use: Not Currently   Drug use: Never     Allergies   Cat dander and Molds & smuts   Review of Systems Review of Systems   Physical Exam Triage Vital Signs ED Triage Vitals  Encounter Vitals Group      BP 09/04/23 1832 113/73     Systolic BP Percentile --      Diastolic BP Percentile --      Pulse Rate 09/04/23 1832 78     Resp 09/04/23 1832 18     Temp 09/04/23 1832 99 F (37.2 C)     Temp Source 09/04/23 1832 Oral     SpO2 09/04/23 1832 97 %     Weight 09/04/23 1829 243 lb (110.2 kg)     Height 09/04/23 1829 5\' 8"  (1.727 m)     Head Circumference --      Peak Flow --      Pain Score 09/04/23 1829 0     Pain Loc --      Pain Education --      Exclude from Growth Chart --    No data found.  Updated Vital Signs BP 113/73 (BP Location: Left Arm)   Pulse 78   Temp 99 F (37.2 C) (Oral)   Resp 18   Ht 5\' 8"  (1.727 m)   Wt 243 lb (110.2 kg)   SpO2 97%   BMI 36.95 kg/m   Visual Acuity Right Eye Distance:   Left Eye Distance:   Bilateral Distance:    Right Eye Near:   Left Eye Near:    Bilateral Near:     Physical Exam Constitutional:      Appearance: Normal appearance.  Eyes:     Extraocular Movements: Extraocular movements intact.  Pulmonary:     Effort: Pulmonary effort is normal.  Musculoskeletal:     Comments: Nonpitting edema to the bilateral lower ankles, 2+ dorsalis pedis pulse, sensation intact, able to bear weight and complete all range of motion of the ankle, no abnormality to the bilateral calf  Neurological:     Mental Status: She is alert and oriented to person, place, and time. Mental status is at baseline.      UC Treatments / Results  Labs (all labs ordered are listed, but only abnormal results are displayed) Labs Reviewed  BASIC METABOLIC PANEL WITH GFR  MAGNESIUM    EKG   Radiology No results found.  Procedures Procedures (including critical care time)  Medications Ordered in UC Medications - No data to display  Initial Impression / Assessment and Plan / UC Course  I have reviewed the triage  vital signs and the nursing notes.  Pertinent labs & imaging results that were available during my care of the patient were reviewed by  me and considered in my medical decision making (see chart for details).  Leg cramping  Vitals are stable, patient in no signs of distress nontoxic-appearing, no signs of thrombosis nor circulatory issue, discussed with patient, stable for outpatient management, unknown etiology of cramping, BMP and magnesium level pending, prescribed baclofen for bedtime as patient endorses symptoms typically occurring overnight to early in the morning, recommended nonpharmacological supportive care, advised follow-up with PCP Final Clinical Impressions(s) / UC Diagnoses   Final diagnoses:  Leg cramping   Discharge Instructions      Today you are evaluated for leg cramping  No Signs of a blood clot which typically occurs on one side and causes significant swelling, pain, redness to the calf muscle, progressively worsening as time passes  No signs of poor circulation as I am easily able to find the pulse, the skin is warm and touch and generally color of the skin is appropriate for your ethnicity, mild discoloration is not consistent with poor circulation which will cause a dark ashen purple color and dryness to the skin  Blood work has been obtained to check your electrolytes, you will be notified of any concerning values until her move forward  You may use muscle relaxant twice at bedtime as needed for additional comfort  ensure you are drinking adequate amounts of water throughout the day as even mild dehydration can contribute his symptoms  May apply Heat over the effected area 10 to 15-minute intervals  May elevate on pillows as needed for comfort and support, this helps to reduce swelling  May wear compression hosiery to help with circulation which also further helps to reduce swelling  Please schedule follow-up appoint with your primary doctor  ED Prescriptions     Medication Sig Dispense Auth. Provider   Baclofen 5 MG TABS Take 1 tablet (5 mg total) by mouth at bedtime as needed. 10 tablet  Nicolis Boody R, NP      PDMP not reviewed this encounter.   Reena Canning, NP 09/04/23 1907

## 2023-09-04 NOTE — Discharge Instructions (Addendum)
 Today you are evaluated for leg cramping  No Signs of a blood clot which typically occurs on one side and causes significant swelling, pain, redness to the calf muscle, progressively worsening as time passes  No signs of poor circulation as I am easily able to find the pulse, the skin is warm and touch and generally color of the skin is appropriate for your ethnicity, mild discoloration is not consistent with poor circulation which will cause a dark ashen purple color and dryness to the skin  Blood work has been obtained to check your electrolytes, you will be notified of any concerning values until her move forward  You may use muscle relaxant twice at bedtime as needed for additional comfort  ensure you are drinking adequate amounts of water throughout the day as even mild dehydration can contribute his symptoms  May apply Heat over the effected area 10 to 15-minute intervals  May elevate on pillows as needed for comfort and support, this helps to reduce swelling  May wear compression hosiery to help with circulation which also further helps to reduce swelling  Please schedule follow-up appoint with your primary doctor

## 2023-09-05 ENCOUNTER — Ambulatory Visit (HOSPITAL_COMMUNITY): Payer: Self-pay

## 2023-09-05 LAB — MAGNESIUM: Magnesium: 1.9 mg/dL (ref 1.6–2.3)

## 2023-09-05 LAB — BASIC METABOLIC PANEL WITH GFR
BUN/Creatinine Ratio: 18 (ref 12–28)
BUN: 16 mg/dL (ref 8–27)
CO2: 25 mmol/L (ref 20–29)
Calcium: 9.4 mg/dL (ref 8.7–10.3)
Chloride: 100 mmol/L (ref 96–106)
Creatinine, Ser: 0.91 mg/dL (ref 0.57–1.00)
Glucose: 98 mg/dL (ref 70–99)
Potassium: 3.7 mmol/L (ref 3.5–5.2)
Sodium: 141 mmol/L (ref 134–144)
eGFR: 70 mL/min/{1.73_m2} (ref >59)

## 2023-11-28 ENCOUNTER — Ambulatory Visit
Admission: RE | Admit: 2023-11-28 | Discharge: 2023-11-28 | Disposition: A | Discharge status: Home / Self Care | Source: Ambulatory Visit | Attending: Emergency Medicine | Admitting: Emergency Medicine

## 2023-11-28 VITALS — BP 127/72 | HR 84 | Temp 98.6°F | Resp 18

## 2023-11-28 DIAGNOSIS — Z113 Encounter for screening for infections with a predominantly sexual mode of transmission: Secondary | ICD-10-CM

## 2023-11-28 DIAGNOSIS — Z114 Encounter for screening for human immunodeficiency virus [HIV]: Secondary | ICD-10-CM

## 2023-11-28 NOTE — Discharge Instructions (Signed)
Labs pending 2-3 days, you will be contacted if positive for any sti and treatment will be sent to the pharmacy, you will have to return to the clinic if positive for gonorrhea to receive treatment   Please refrain from having sex until labs results, if positive please refrain from having sex until treatment complete and symptoms resolve   If positive for HIV, Syphilis, Chlamydia  gonorrhea or trichomoniasis please notify partner or partners so they may tested as well  Moving forward, it is recommended you use some form of protection against the transmission of sti infections  such as condoms or dental dams with each sexual encounter   

## 2023-11-28 NOTE — ED Triage Notes (Signed)
 Patient here for complete STD check up. Denies any symptoms at this time. Voice no other complaints.

## 2023-11-28 NOTE — ED Provider Notes (Signed)
 Laura Walls    CSN: [id number] Arrival date & time: 11/28/23  1148      History   Chief Complaint No chief complaint on file.   HPI Laura Walls is a 63 y.o. female.   Patient presents for routine STI testing.  Denies known exposure, denying all symptoms.  Entering into a new relationship and her and her partner are being tested prior to.  Past Medical History:  Diagnosis Date   Carpal tunnel syndrome    both wrist   Hormone disorder    Hypertension    Thyroid  disease     Patient Active Problem List   Diagnosis Date Noted   Osteoarthritis of both knees 04/30/2023   Arthralgia of both knees 02/27/2023   Bursitis of right shoulder 02/27/2023   Osteoarthritis of left knee 02/27/2023   Pain of both shoulder joints 02/27/2023   Lymphedema 11/24/2021   Vitamin D  deficiency 01/13/2020   Hyperlipidemia 01/22/2019   Hypothyroid 01/22/2019   Breast cancer screening by mammogram 01/22/2019   Essential hypertension 01/22/2019   Acute pain of left knee 01/22/2019   Hematuria 01/22/2019   Class 2 severe obesity due to excess calories with serious comorbidity and body mass index (BMI) of 36.0 to 36.9 in adult [facility]) 01/22/2019   Lower extremity edema 01/22/2019   Family history of colon cancer in mother 01/22/2019    Past Surgical History:  Procedure Laterality Date   ABDOMINAL HYSTERECTOMY     APPENDECTOMY     CARPAL TUNNEL RELEASE     CESAREAN SECTION     HEEL SPUR EXCISION      OB History     Gravida  5   Para  1   Term  1   Preterm      AB  4   Living  1      SAB  4   IAB      Ectopic      Multiple      Live Births  1            Home Medications    Prior to Admission medications   Medication Sig Start Date End Date Taking? Authorizing Provider  atorvastatin  (LIPITOR) 10 MG tablet Take 1 tablet (10 mg total) by mouth daily. 02/04/23   Claudene Tanda POUR, PA-C  Baclofen  5 MG TABS Take 1 tablet (5 mg total) by mouth at bedtime  as needed. 09/04/23   Zenith Lamphier, Shelba SAUNDERS, NP  gabapentin  (NEURONTIN ) 100 MG capsule Take 2 capsules (200 mg total) by mouth at bedtime. 07/17/23   Magnant, Charles L, PA-C  gabapentin  (NEURONTIN ) 100 MG capsule Take 1 capsule (100 mg total) by mouth 3 (three) times daily. 08/30/23   Addie Cordella Hamilton, MD  hydrochlorothiazide  (HYDRODIURIL ) 25 MG tablet TAKE 1 TABLET (25 MG TOTAL) BY MOUTH DAILY. 05/06/23   Claudene Tanda POUR, PA-C  levothyroxine  (SYNTHROID ) 150 MCG tablet Take 1 tablet (150 mcg total) by mouth daily. Follow up labs in 30 days. 01/16/23   Claudene Tanda POUR, PA-C  meloxicam  (MOBIC ) 15 MG tablet Take 1 tablet (15 mg total) by mouth daily with breakfast. 07/17/23   Magnant, Carlin CROME, PA-C  traMADol  (ULTRAM ) 50 MG tablet Take 1 tablet (50 mg total) by mouth at bedtime as needed. 06/26/23   Addie Cordella Hamilton, MD  traMADol  (ULTRAM ) 50 MG tablet Take 1 tablet (50 mg total) by mouth every 12 (twelve) hours as needed. 08/30/23   Addie Cordella Hamilton, MD  Family History Family History  Problem Relation Age of Onset   Colon cancer Mother    Leukemia Father    Breast cancer Neg Hx    Bladder Cancer Neg Hx    Kidney cancer Neg Hx     Social History Social History   Tobacco Use   Smoking status: Never   Smokeless tobacco: Never  Vaping Use   Vaping status: Never Used  Substance Use Topics   Alcohol use: Not Currently   Drug use: Never     Allergies   Cat dander and Molds & smuts   Review of Systems Review of Systems  Genitourinary: Negative.      Physical Exam Triage Vital Signs ED Triage Vitals  Encounter Vitals Group     BP      Girls Systolic BP Percentile      Girls Diastolic BP Percentile      Boys Systolic BP Percentile      Boys Diastolic BP Percentile      Pulse      Resp      Temp      Temp src      SpO2      Weight      Height      Head Circumference      Peak Flow      Pain Score      Pain Loc      Pain Education      Exclude from Growth Chart    No  data found.  Updated Vital Signs There were no vitals taken for this visit.  Visual Acuity Right Eye Distance:   Left Eye Distance:   Bilateral Distance:    Right Eye Near:   Left Eye Near:    Bilateral Near:     Physical Exam Constitutional:      Appearance: Normal appearance.  Eyes:     Extraocular Movements: Extraocular movements intact.  Pulmonary:     Effort: Pulmonary effort is normal.  Genitourinary:    Comments: deferred Neurological:     Mental Status: She is alert and oriented to person, place, and time.      UC Treatments / Results  Labs (all labs ordered are listed, but only abnormal results are displayed) Labs Reviewed  RPR  HIV ANTIBODY (ROUTINE TESTING W REFLEX)  CERVICOVAGINAL ANCILLARY ONLY    EKG   Radiology No results found.  Procedures Procedures (including critical care time)  Medications Ordered in UC Medications - No data to display  Initial Impression / Assessment and Plan / UC Course  I have reviewed the triage vital signs and the nursing notes.  Pertinent labs & imaging results that were available during my care of the patient were reviewed by me and considered in my medical decision making (see chart for details).  Routine screening for STI, encounter for screening for HIV   STI labs pending will treat per protocol, advised abstinence until lab results, and/or treatment is complete, advised condom use during all sexual encounters moving, may follow-up with urgent care as needed  Final Clinical Impressions(s) / UC Diagnoses   Final diagnoses:  Routine screening for STI (sexually transmitted infection)  Encounter for screening for human immunodeficiency virus (HIV)     Discharge Instructions      Labs pending 2-3 days, you will be contacted if positive for any sti and treatment will be sent to the pharmacy, you will have to return to the clinic if positive for gonorrhea to  receive treatment   Please refrain from having  sex until labs results, if positive please refrain from having sex until treatment complete and symptoms resolve   If positive for HIV, Syphilis, Chlamydia  gonorrhea or trichomoniasis please notify partner or partners so they may tested as well  Moving forward, it is recommended you use some form of protection against the transmission of sti infections  such as condoms or dental dams with each sexual encounter     ED Prescriptions   None    PDMP not reviewed this encounter.   Teresa Shelba SAUNDERS, NP 11/28/23 1200

## 2023-11-29 LAB — RPR: RPR Ser Ql: NONREACTIVE

## 2023-11-29 LAB — CERVICOVAGINAL ANCILLARY ONLY
Bacterial Vaginitis (gardnerella): NEGATIVE
Candida Glabrata: NEGATIVE
Candida Vaginitis: NEGATIVE
Chlamydia: NEGATIVE
Comment: NEGATIVE
Comment: NEGATIVE
Comment: NEGATIVE
Comment: NEGATIVE
Comment: NEGATIVE
Comment: NORMAL
Neisseria Gonorrhea: NEGATIVE
Trichomonas: NEGATIVE

## 2023-11-29 LAB — HIV ANTIBODY (ROUTINE TESTING W REFLEX): HIV Screen 4th Generation wRfx: NONREACTIVE

## 2023-12-04 ENCOUNTER — Ambulatory Visit: Payer: Self-pay

## 2024-01-29 ENCOUNTER — Encounter: Payer: Self-pay | Admitting: Family Medicine

## 2024-01-29 ENCOUNTER — Ambulatory Visit (INDEPENDENT_AMBULATORY_CARE_PROVIDER_SITE_OTHER): Admitting: Family Medicine

## 2024-01-29 ENCOUNTER — Other Ambulatory Visit (HOSPITAL_COMMUNITY): Payer: Self-pay

## 2024-01-29 ENCOUNTER — Other Ambulatory Visit: Payer: Self-pay | Admitting: Family Medicine

## 2024-01-29 VITALS — BP 134/68 | HR 72 | Temp 98.1°F | Ht 68.0 in | Wt 247.0 lb

## 2024-01-29 DIAGNOSIS — E66812 Obesity, class 2: Secondary | ICD-10-CM

## 2024-01-29 DIAGNOSIS — Z23 Encounter for immunization: Secondary | ICD-10-CM

## 2024-01-29 DIAGNOSIS — Z6836 Body mass index (BMI) 36.0-36.9, adult: Secondary | ICD-10-CM

## 2024-01-29 DIAGNOSIS — Z13820 Encounter for screening for osteoporosis: Secondary | ICD-10-CM

## 2024-01-29 DIAGNOSIS — M170 Bilateral primary osteoarthritis of knee: Secondary | ICD-10-CM

## 2024-01-29 DIAGNOSIS — I10 Essential (primary) hypertension: Secondary | ICD-10-CM

## 2024-01-29 DIAGNOSIS — E782 Mixed hyperlipidemia: Secondary | ICD-10-CM

## 2024-01-29 DIAGNOSIS — I890 Lymphedema, not elsewhere classified: Secondary | ICD-10-CM

## 2024-01-29 DIAGNOSIS — E039 Hypothyroidism, unspecified: Secondary | ICD-10-CM

## 2024-01-29 DIAGNOSIS — E559 Vitamin D deficiency, unspecified: Secondary | ICD-10-CM

## 2024-01-29 MED ORDER — SEMAGLUTIDE-WEIGHT MANAGEMENT 0.25 MG/0.5ML ~~LOC~~ SOAJ: 0.2500 mg | SUBCUTANEOUS | 0 refills | Status: DC

## 2024-01-29 MED ORDER — LEVOTHYROXINE SODIUM 150 MCG PO TABS: 150.0000 ug | ORAL_TABLET | Freq: Every day | ORAL | 3 refills | Status: DC

## 2024-01-29 MED ORDER — SEMAGLUTIDE-WEIGHT MANAGEMENT 0.5 MG/0.5ML ~~LOC~~ SOAJ: 0.5000 mg | SUBCUTANEOUS | 0 refills | Status: DC

## 2024-01-29 NOTE — Patient Instructions (Signed)
     To keep you healthy, please keep in mind the following health maintenance items that you are due for:   Health Maintenance Due  Topic Date Due   Medicare Annual Wellness (AWV)  Never done   DTaP/Tdap/Td (1 - Tdap) Never done   Pneumococcal Vaccine: 50+ Years (1 of 1 - PCV) Never done   Influenza Vaccine  11/22/2023   DEXA SCAN  Never done     Best Wishes,   Dr. Lang

## 2024-01-29 NOTE — Progress Notes (Signed)
 Pt has Medicare Part D, which will only cover [employer] if pt has documentation of any of the following: Myocardial infarction (MI), (2) Prior ischemic or hemorrhagic stroke, (3) Symptomatic peripheral arterial disease (PAD) as evidence by one of the following (a) Intermittent claudication with ankle-brachial index (ABI) less than 0.85 (at rest); (b) Peripheral arterial revascularization procedure; (c) Amputation due to atherosclerotic disease.

## 2024-01-29 NOTE — Assessment & Plan Note (Signed)
 Osteoarthritis of bilateral knees with associated arthralgia and chronic lymphedema Chronic osteoarthritis of bilateral knees with associated arthralgia and chronic lymphedema. Receives knee injections for management and uses compression stockings for lymphedema. Lymphedema attributed to knee issues and fluid retention. - Continue current management with knee injections and use of compression stockings.

## 2024-01-29 NOTE — Assessment & Plan Note (Signed)
 Hypothyroidism, chronic Chronic hypothyroidism managed with Synthroid  150 mcg daily. - Refill Synthroid  150 mcg daily. - Order TSH, T4, T3 levels.

## 2024-01-29 NOTE — Assessment & Plan Note (Signed)
 Hyperlipidemia, chronic Chronic hyperlipidemia with elevated cholesterol levels. Currently on atorvastatin  (Lipitor) 10 mg daily. Coronary calcium  score was 0.  - Order lipid panel.

## 2024-01-29 NOTE — Telephone Encounter (Signed)
 Pt has Medicare Part D, they will only cover for CV risk reduction if patient has evidence of any of the following: Myocardial infarction (MI), (2) Prior ischemic or hemorrhagic stroke, (3) Symptomatic peripheral arterial disease (PAD) as evidence by one of the following (a) Intermittent claudication with ankle-brachial index (ABI) less than 0.85 (at rest); (b) Peripheral arterial revascularization procedure; (c) Amputation due to atherosclerotic disease

## 2024-01-29 NOTE — Assessment & Plan Note (Signed)
  Vitamin D  deficiency, chronic Chronic vitamin D  deficiency. - recommend OTC vitamin D  supplementation

## 2024-01-29 NOTE — Assessment & Plan Note (Signed)
 Hypertension, chronic Chronic hypertension with current blood pressure reading of 134/68 mmHg. Managed on hydrochlorothiazide  25 mg daily, effective in controlling blood pressure. - Continue hydrochlorothiazide  25 mg daily.

## 2024-01-29 NOTE — Assessment & Plan Note (Signed)
 Chronic  Recommended compression hose as tolerated, continued elevation  Unable to tolerate frequent urination on lasix  in the past

## 2024-01-29 NOTE — Assessment & Plan Note (Signed)
 Obesity, class 2 Chronic  Class 2 obesity with a BMI of 37.56 and weight of 247 lbs. Interested in weight management options, specifically injectable medications. Discussed semaglutide [facility]) for weight loss and cardiovascular risk reduction. Medications intended for chronic use; potential for weight regain if discontinued. Informed of side effects: constipation, nausea, rare vascular complications. ASCVD score of 15 supports use of semaglutide for cardiovascular benefits. - Start semaglutide 0.25 mg once weekly for 4 weeks, then increase to 0.5 mg once weekly. - Initiate prior authorization process for semaglutide. - Follow up in 2 months for obesity management. -CMP, A1c and lipid panel collected today

## 2024-01-29 NOTE — Progress Notes (Signed)
 "   New patient visit   Patient: Laura Walls   DOB: May 02, 1960   63 y.o. Female  MRN: [id number] Visit Date: 01/29/2024  Today's healthcare provider: Rockie Agent, MD   Chief Complaint  Patient presents with   Establish Care    Patient is present to establish care with new pcp.  Patient is eating a normal die, attempting intermittent fasting occasional. Exercising by walking during the week, line dancing one day week.  Requesting med refills  Screening: Dexa- will do  Vaccines: flu/ prevnar- will do   Obesity    Patient is interested in starting injection for weight loss   Subjective    Laura Walls is a 63 y.o. female who presents today as a new patient to establish care.   HPI     Establish Care    Additional comments: Patient is present to establish care with new pcp.  Patient is eating a normal die, attempting intermittent fasting occasional. Exercising by walking during the week, line dancing one day week.  Requesting med refills  Screening: Dexa- will do  Vaccines: flu/ prevnar- will do        Obesity    Additional comments: Patient is interested in starting injection for weight loss      Last edited by Cherry Chiquita HERO, CMA on 01/29/2024 11:06 AM.       Discussed the use of AI scribe software for clinical note transcription with the patient, who gave verbal consent to proceed.  History of Present Illness Laura Walls is a 63 year old female who presents to establish care as a new patient.  She has a history of class II obesity with a BMI of 37.56 and a current weight of 247 pounds. She is interested in weight management options, including injectable medications for weight loss. She practices intermittent fasting and avoids eating late to prevent night sweats, which she experiences more severely when eating late. These symptoms have been present since her twenties, even before her hysterectomy in 1998.  She has chronic hypertension  and is currently taking hydrochlorothiazide  25 mg daily. Her blood pressure has been slightly elevated in the past.  She has chronic hyperlipidemia and is on atorvastatin  10 mg daily. She has a history of elevated cholesterol levels and is due for a lipid panel to assess her current status.  She has chronic hypothyroidism and is taking Synthroid  150 micrograms daily. Her dosage was increased from 125 micrograms in March, and she is due for a thyroid  function test to reassess her levels.  She has chronic osteoarthritis and lymphedema affecting her knees, which has been managed with injections, the last of which was two weeks ago. She experiences swelling around her ankles, which is attributed to her knee condition. She uses compression socks, especially when traveling, to manage the swelling.  She reports a history of night sweats and hot flashes, which she has experienced since her twenties. These symptoms are exacerbated by eating late and have persisted despite her hysterectomy in 1998.  She has a history of vitamin D  deficiency, which is a chronic issue.  No recent chest pain or significant cardiac symptoms. She experiences difficulty breathing when lying on her left side, but this is infrequent.      Past Medical History:  Diagnosis Date   Allergy 1980   Arthritis 2020   Carpal tunnel syndrome    both wrist   Cataract 2015   Hormone disorder    Hypertension    Sleep apnea  1995   Thyroid  disease     Outpatient Medications Prior to Visit  Medication Sig   atorvastatin  (LIPITOR) 10 MG tablet Take 1 tablet (10 mg total) by mouth daily.   hydrochlorothiazide  (HYDRODIURIL ) 25 MG tablet TAKE 1 TABLET (25 MG TOTAL) BY MOUTH DAILY.   [DISCONTINUED] levothyroxine  (SYNTHROID ) 150 MCG tablet Take 1 tablet (150 mcg total) by mouth daily. Follow up labs in 30 days.   [DISCONTINUED] Baclofen  5 MG TABS Take 1 tablet (5 mg total) by mouth at bedtime as needed.   [DISCONTINUED] gabapentin   (NEURONTIN ) 100 MG capsule Take 2 capsules (200 mg total) by mouth at bedtime.   [DISCONTINUED] gabapentin  (NEURONTIN ) 100 MG capsule Take 1 capsule (100 mg total) by mouth 3 (three) times daily.   [DISCONTINUED] meloxicam  (MOBIC ) 15 MG tablet Take 1 tablet (15 mg total) by mouth daily with breakfast.   [DISCONTINUED] traMADol  (ULTRAM ) 50 MG tablet Take 1 tablet (50 mg total) by mouth at bedtime as needed.   [DISCONTINUED] traMADol  (ULTRAM ) 50 MG tablet Take 1 tablet (50 mg total) by mouth every 12 (twelve) hours as needed.   No facility-administered medications prior to visit.    Past Surgical History:  Procedure Laterality Date   ABDOMINAL HYSTERECTOMY     APPENDECTOMY     CARPAL TUNNEL RELEASE     CESAREAN SECTION     HEEL SPUR EXCISION     Family Status  Relation Name Status   Mother Oniya Mandarino Deceased   Father Daneille Desilva Deceased   Mat Aunt  Deceased   Mat Uncle  Deceased   Pat Aunt  Deceased   Pat Uncle  Deceased   Sister Johnsie Daring Alive   Sister Charolette Louder Alive   Neg Hx  (Not Specified)  No partnership data on file   Family History  Problem Relation Age of Onset   Colon cancer Mother    Cancer Mother    Miscarriages / Stillbirths Mother    Leukemia Father    Asthma Father    Obesity Father    Arthritis Sister    Diabetes Sister    Diabetes Sister    Breast cancer Neg Hx    Bladder Cancer Neg Hx    Kidney cancer Neg Hx    Social History   Socioeconomic History   Marital status: Divorced    Spouse name: Not on file   Number of children: Not on file   Years of education: Not on file   Highest education level: Master's degree (e.g., MA, MS, MEng, MEd, MSW, MBA)  Occupational History   Not on file  Tobacco Use   Smoking status: Never   Smokeless tobacco: Never  Vaping Use   Vaping status: Never Used  Substance and Sexual Activity   Alcohol use: Not Currently    Comment: socially, less than monthly   Drug use: Never   Sexual  activity: Not Currently  Other Topics Concern   Not on file  Social History Narrative   Not on file   Social Drivers of Health   Financial Resource Strain: Low Risk  (01/28/2024)   Overall Financial Resource Strain (CARDIA)    Difficulty of Paying Living Expenses: Not hard at all  Food Insecurity: No Food Insecurity (01/28/2024)   Hunger Vital Sign    Worried About Running Out of Food in the Last Year: Never true    Ran Out of Food in the Last Year: Never true  Transportation Needs: No Transportation Needs (01/28/2024)  PRAPARE - Administrator, Civil Service (Medical): No    Lack of Transportation (Non-Medical): No  Physical Activity: Sufficiently Active (01/28/2024)   Exercise Vital Sign    Days of Exercise per Week: 3 days    Minutes of Exercise per Session: 60 min  Stress: No Stress Concern Present (01/28/2024)   [employer] of Occupational Health - Occupational Stress Questionnaire    Feeling of Stress: Only a little  Social Connections: Moderately Integrated (01/28/2024)   Social Connection and Isolation Panel    Frequency of Communication with Friends and Family: More than three times a week    Frequency of Social Gatherings with Friends and Family: Once a week    Attends Religious Services: More than 4 times per year    Active Member of Clubs or Organizations: Yes    Attends Engineer, Structural: More than 4 times per year    Marital Status: Divorced     Allergies  Allergen Reactions   Cat Dander Other (See Comments)   Molds & Smuts Other (See Comments)    Immunization History  Administered Date(s) Administered   Hepatitis A, Adult 10/24/2022   Influenza,inj,Quad PF,6+ Mos 01/22/2019   Influenza,inj,Quad PF,6-35 Mos 02/15/2022   Influenza-Unspecified 02/23/2021, 02/07/2023   PFIZER Comirnaty(Gray Top)Covid-19 Tri-Sucrose Vaccine 04/07/2020   PFIZER(Purple Top)SARS-COV-2 Vaccination 07/10/2019, 07/31/2019    Health Maintenance  Topic  Date Due   Medicare Annual Wellness (AWV)  Never done   DTaP/Tdap/Td (1 - Tdap) Never done   Pneumococcal Vaccine: 50+ Years (1 of 1 - PCV) Never done   Influenza Vaccine  11/22/2023   DEXA SCAN  Never done   COVID-19 Vaccine (4 - 2025-26 season) 02/14/2024 (Originally 12/23/2023)   Zoster Vaccines- Shingrix (1 of 2) 04/30/2024 (Originally 12/03/2008)   Cervical Cancer Screening (HPV/Pap Cotest)  01/28/2025 (Originally 12/03/1988)   Hepatitis C Screening  01/28/2025 (Originally 12/03/1976)   Mammogram  08/06/2025   Colonoscopy  06/25/2033   HIV Screening  Completed   Hepatitis B Vaccines 19-59 Average Risk  Aged Out   Meningococcal B Vaccine  Aged Out    Patient Care Team: Sharma Coyer, MD as PCP - General (Family Medicine)  Review of Systems  Last CBC Lab Results  Component Value Date   WBC 7.3 09/14/2022   HGB 12.4 09/14/2022   HCT 38.8 09/14/2022   MCV 83 09/14/2022   MCH 26.6 09/14/2022   RDW 13.6 09/14/2022   PLT 405 09/14/2022   Last metabolic panel Lab Results  Component Value Date   GLUCOSE 98 09/04/2023   NA 141 09/04/2023   K 3.7 09/04/2023   CL 100 09/04/2023   CO2 25 09/04/2023   BUN 16 09/04/2023   CREATININE 0.91 09/04/2023   EGFR 70 09/04/2023   CALCIUM  9.4 09/04/2023   PHOS 3.6 09/14/2022   PROT 7.1 09/14/2022   ALBUMIN 4.0 09/14/2022   LABGLOB 3.1 09/14/2022   AGRATIO 1.3 09/14/2022   BILITOT 0.5 09/14/2022   ALKPHOS 103 09/14/2022   AST 18 09/14/2022   ALT 13 09/14/2022   ANIONGAP 8 08/26/2021   Last lipids Lab Results  Component Value Date   CHOL 247 (H) 01/31/2023   HDL 39 (L) 01/31/2023   LDLCALC 185 (H) 01/31/2023   TRIG 127 01/31/2023   CHOLHDL 6.3 (H) 01/31/2023   The 10-year ASCVD risk score (Arnett DK, et al., 2019) is: 13.2%  Last hemoglobin A1c Lab Results  Component Value Date   HGBA1C 5.0 08/30/2021  Last thyroid  functions Lab Results  Component Value Date   TSH 4.270 01/31/2023   T4TOTAL 9.6 01/31/2023    Last vitamin D  Lab Results  Component Value Date   VD25OH 61.6 03/23/2020   Last vitamin B12 and Folate Lab Results  Component Value Date   VITAMINB12 373 01/01/2020      Objective    BP 134/68 (Cuff Size: Large)   Pulse 72   Temp 98.1 F (36.7 C) (Oral)   Ht 5' 8 (1.727 m)   Wt 247 lb (112 kg)   SpO2 100%   BMI 37.56 kg/m  BP Readings from Last 3 Encounters:  01/29/24 134/68  11/28/23 127/72  09/04/23 113/73   Wt Readings from Last 3 Encounters:  01/29/24 247 lb (112 kg)  09/04/23 243 lb (110.2 kg)  05/06/23 246 lb (111.6 kg)        Depression Screen    01/29/2024   11:15 AM  PHQ 2/9 Scores  PHQ - 2 Score 0  PHQ- 9 Score 2   No results found for any visits on 01/29/24.   Physical Exam Vitals reviewed.  Constitutional:      General: She is not in acute distress.    Appearance: Normal appearance. She is not ill-appearing.  Cardiovascular:     Rate and Rhythm: Normal rate and regular rhythm.  Pulmonary:     Effort: Pulmonary effort is normal. No respiratory distress.     Breath sounds: No wheezing, rhonchi or rales.  Neurological:     Mental Status: She is alert and oriented to person, place, and time.  Psychiatric:        Mood and Affect: Mood normal.        Behavior: Behavior normal.        Assessment & Plan      Problem List Items Addressed This Visit     Class 2 severe obesity due to excess calories with serious comorbidity and body mass index (BMI) of 36.0 to 36.9 in adult   Obesity, class 2 Chronic  Class 2 obesity with a BMI of 37.56 and weight of 247 lbs. Interested in weight management options, specifically injectable medications. Discussed semaglutide  (Wegovy ) for weight loss and cardiovascular risk reduction. Medications intended for chronic use; potential for weight regain if discontinued. Informed of side effects: constipation, nausea, rare vascular complications. ASCVD score of 15 supports use of semaglutide  for  cardiovascular benefits. - Start semaglutide  0.25 mg once weekly for 4 weeks, then increase to 0.5 mg once weekly. - Initiate prior authorization process for semaglutide . - Follow up in 2 months for obesity management. -CMP, A1c and lipid panel collected today       Relevant Medications   semaglutide -weight management (WEGOVY ) 0.25 MG/0.5ML SOAJ SQ injection   semaglutide -weight management (WEGOVY ) 0.5 MG/0.5ML SOAJ SQ injection (Start on 03/26/2024)   Other Relevant Orders   HgB A1c   Essential hypertension   Hypertension, chronic Chronic hypertension with current blood pressure reading of 134/68 mmHg. Managed on hydrochlorothiazide  25 mg daily, effective in controlling blood pressure. - Continue hydrochlorothiazide  25 mg daily.      Relevant Orders   Comprehensive Metabolic Panel (CMET)   Hyperlipidemia - Primary   Hyperlipidemia, chronic Chronic hyperlipidemia with elevated cholesterol levels. Currently on atorvastatin  (Lipitor) 10 mg daily. Coronary calcium  score was 0.  - Order lipid panel.      Relevant Orders   Lipid panel   Hypothyroid   Hypothyroidism, chronic Chronic hypothyroidism managed with Synthroid  150 mcg  daily. - Refill Synthroid  150 mcg daily. - Order TSH, T4, T3 levels.      Relevant Medications   levothyroxine  (SYNTHROID ) 150 MCG tablet   Other Relevant Orders   TSH+T4F+T3Free   Lymphedema   Chronic  Recommended compression hose as tolerated, continued elevation  Unable to tolerate frequent urination on lasix  in the past       Relevant Orders   Comprehensive Metabolic Panel (CMET)   Osteoarthritis of both knees   Osteoarthritis of bilateral knees with associated arthralgia and chronic lymphedema Chronic osteoarthritis of bilateral knees with associated arthralgia and chronic lymphedema. Receives knee injections for management and uses compression stockings for lymphedema. Lymphedema attributed to knee issues and fluid retention. - Continue current  management with knee injections and use of compression stockings.      Vitamin D  deficiency    Vitamin D  deficiency, chronic Chronic vitamin D  deficiency. - recommend OTC vitamin D  supplementation       Other Visit Diagnoses       Immunization due       Relevant Orders   Flu vaccine trivalent PF, 6mos and older(Flulaval,Afluria,Fluarix,Fluzone)   Pneumococcal conjugate vaccine 20-valent (Prevnar 20)     Screening for osteoporosis       Relevant Orders   DG Bone Density       Assessment & Plan    General Health Maintenance Routine health maintenance. Received influenza and pneumococcal vaccines. Bone density scan ordered for osteoporosis screening. A1c test ordered for diabetes screening. - Administered influenza vaccine. - Administered pneumococcal vaccine. - Order bone density scan for osteoporosis screening. - Order A1c for diabetes screening.      Return in about 2 months (around 03/30/2024) for Weight MGMT.      Rockie Agent, MD  [facility] [phone] (phone) [phone] (fax)  [facility] "

## 2024-01-30 LAB — TSH+T4F+T3FREE
Free T4: 1.12 ng/dL (ref 0.82–1.77)
T3, Free: 2.2 pg/mL (ref 2.0–4.4)
TSH: 6.87 u[IU]/mL — ABNORMAL HIGH (ref 0.450–4.500)

## 2024-01-30 LAB — HEMOGLOBIN A1C
Est. average glucose Bld gHb Est-mCnc: 108 mg/dL
Hgb A1c MFr Bld: 5.4 % (ref 4.8–5.6)

## 2024-01-30 LAB — LIPID PANEL
Chol/HDL Ratio: 5.4 ratio — ABNORMAL HIGH (ref 0.0–4.4)
Cholesterol, Total: 216 mg/dL — ABNORMAL HIGH (ref 100–199)
HDL: 40 mg/dL (ref >39)
LDL Chol Calc (NIH): 143 mg/dL — ABNORMAL HIGH (ref 0–99)
Triglycerides: 183 mg/dL — ABNORMAL HIGH (ref 0–149)
VLDL Cholesterol Cal: 33 mg/dL (ref 5–40)

## 2024-01-30 LAB — COMPREHENSIVE METABOLIC PANEL WITH GFR
ALT: 14 IU/L (ref 0–32)
AST: 14 IU/L (ref 0–40)
Albumin: 4 g/dL (ref 3.9–4.9)
Alkaline Phosphatase: 96 IU/L (ref 49–135)
BUN/Creatinine Ratio: 11 — ABNORMAL LOW (ref 12–28)
BUN: 10 mg/dL (ref 8–27)
Bilirubin Total: 0.4 mg/dL (ref 0.0–1.2)
CO2: 26 mmol/L (ref 20–29)
Calcium: 9.4 mg/dL (ref 8.7–10.3)
Chloride: 104 mmol/L (ref 96–106)
Creatinine, Ser: 0.89 mg/dL (ref 0.57–1.00)
Globulin, Total: 2.5 g/dL (ref 1.5–4.5)
Glucose: 107 mg/dL — ABNORMAL HIGH (ref 70–99)
Potassium: 4.4 mmol/L (ref 3.5–5.2)
Sodium: 144 mmol/L (ref 134–144)
Total Protein: 6.5 g/dL (ref 6.0–8.5)
eGFR: 72 mL/min/1.73 (ref >59)

## 2024-02-09 IMAGING — CT CT ANGIO CHEST
2 of 6 series · 17 of 46 positions shown · IV contrast (APPLIED)
Comparison: Chest radiograph dated 08/26/2021.

CLINICAL DATA: Chest pain and shortness of breath.

EXAM:
CT ANGIOGRAPHY CHEST WITH CONTRAST
TECHNIQUE: Multidetector CT imaging of the chest was performed using the
standard protocol during bolus administration of intravenous
contrast. Multiplanar CT image reconstructions and MIPs were
obtained to evaluate the vascular anatomy.

[Series 6: thins · axial · 0.71mm/px · z∈[-615,-371]mm · 14 of 383 slices shown]
[im 17/383  lung]
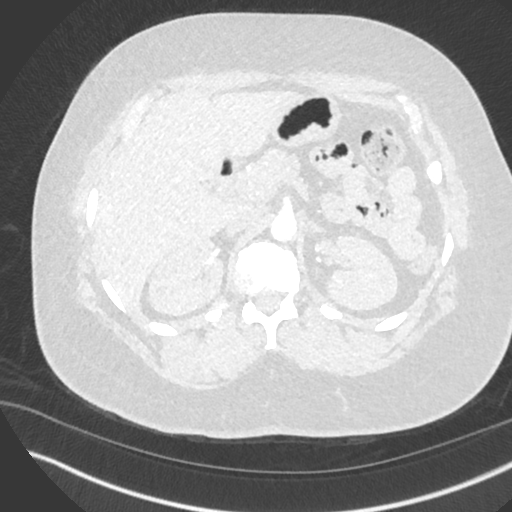
[im 50/383  soft-tissue]
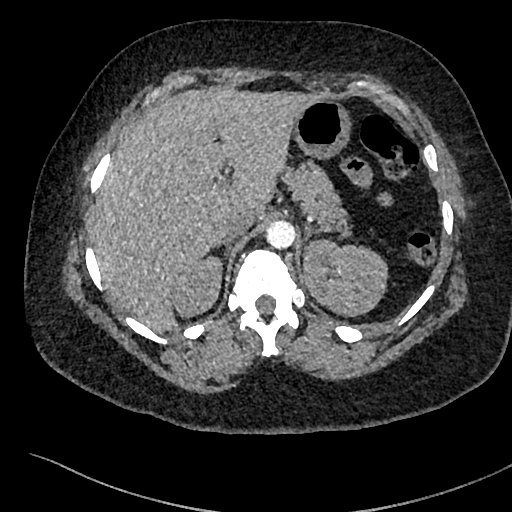
[im 67/383  lung]
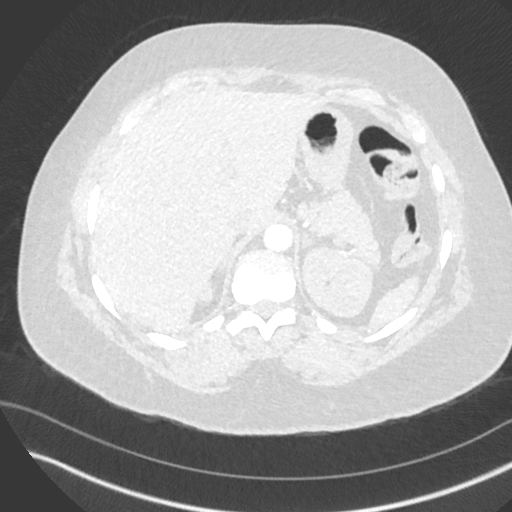
[im 100/383  soft-tissue]
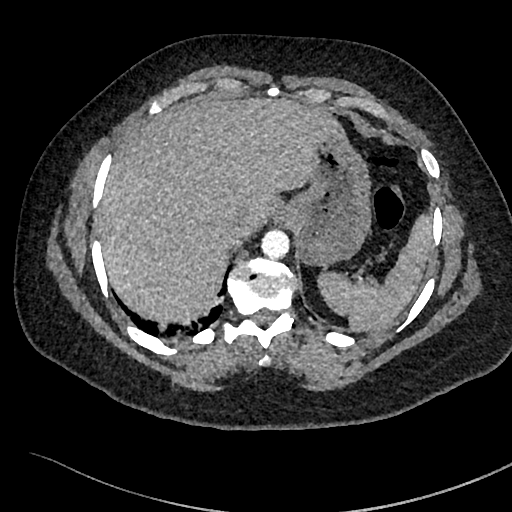
[im 133/383  lung]
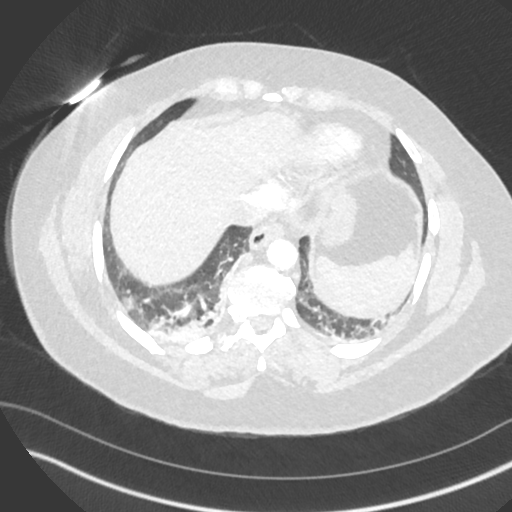
[im 150/383  soft-tissue]
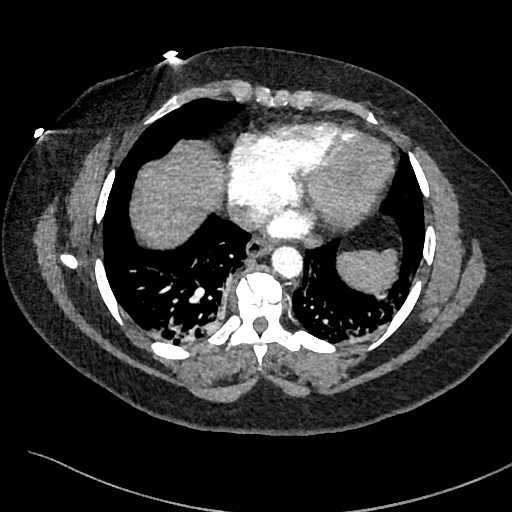
[im 183/383  lung]
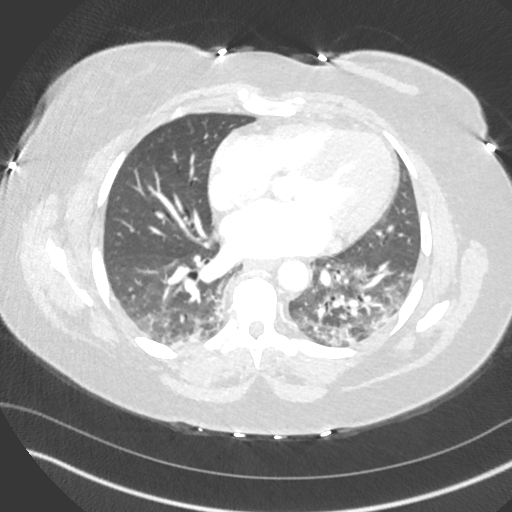
[im 200/383  soft-tissue]
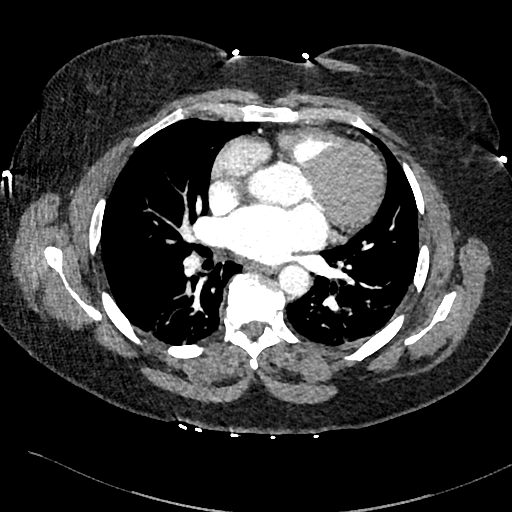
[im 233/383  lung]
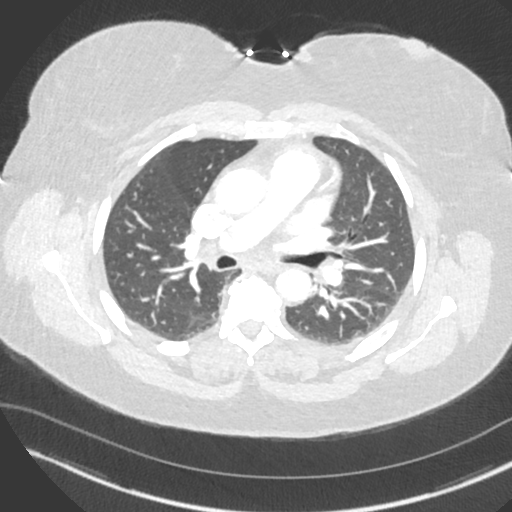
[im 250/383  soft-tissue]
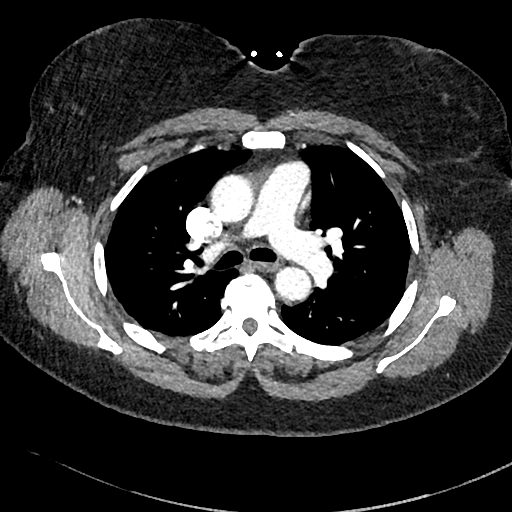
[im 283/383  lung]
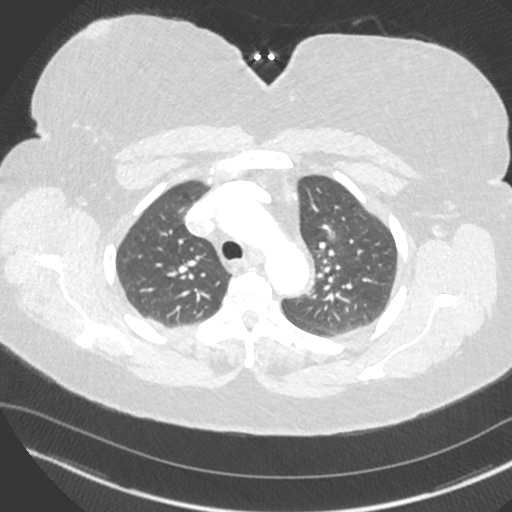
[im 316/383  soft-tissue]
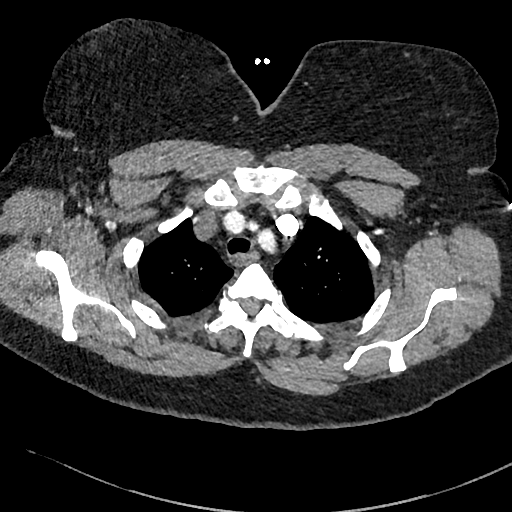
[im 333/383  lung]
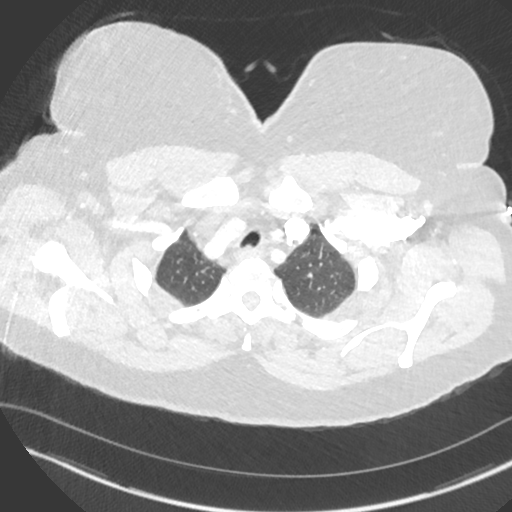
[im 366/383  soft-tissue]
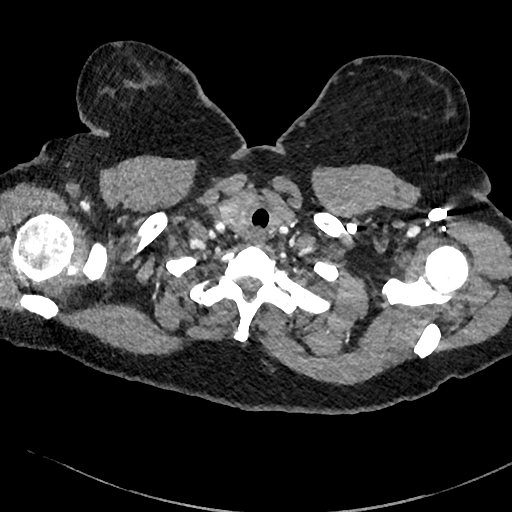

[Series 7: cor · coronal · 0.55mm/px · 3 of 152 slices shown]
[im 38/152  soft-tissue]
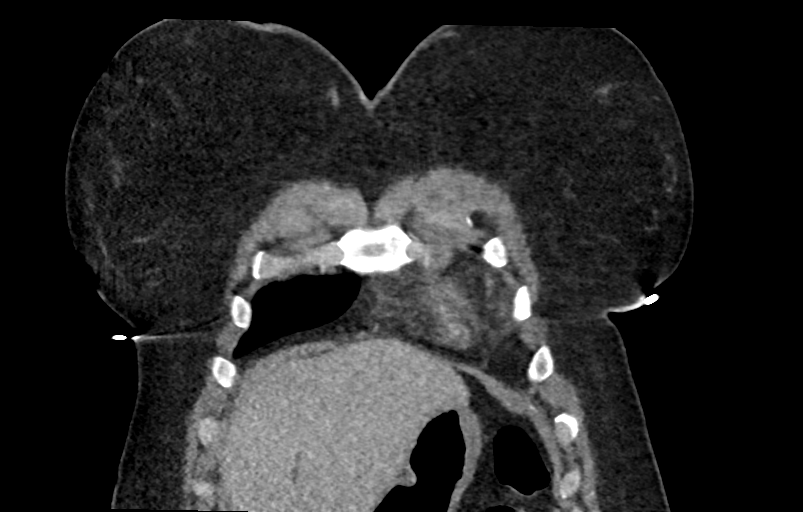
[im 76/152  soft-tissue]
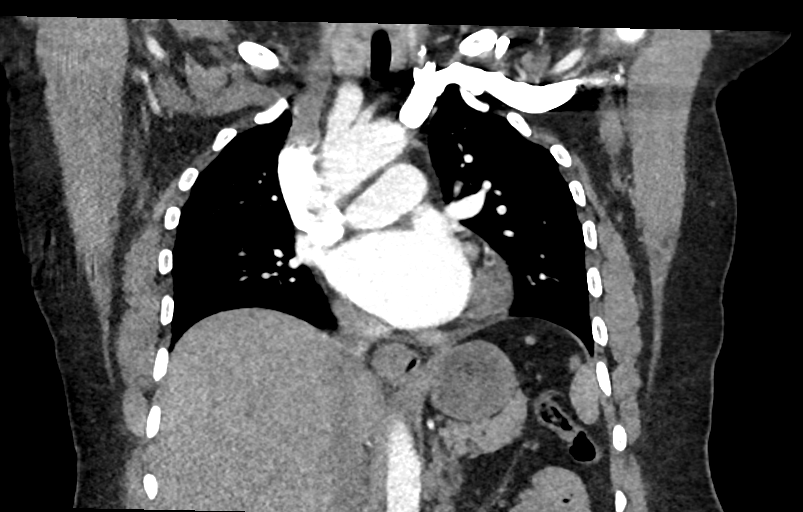
[im 114/152  soft-tissue]
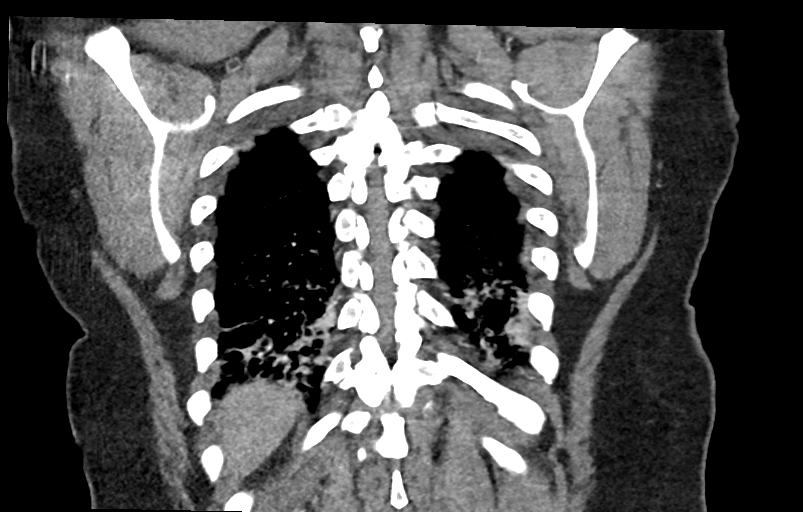

[17 of 46 positions shown; findings below may reference images not displayed]

RADIATION DOSE REDUCTION: This exam was performed according to the
departmental dose-optimization program which includes automated
exposure control, adjustment of the mA and/or kV according to
patient size and/or use of iterative reconstruction technique.

CONTRAST:  75mL OMNIPAQUE IOHEXOL 350 MG/ML SOLN
FINDINGS: Cardiovascular: There is no cardiomegaly or pericardial effusion.
The thoracic aorta is unremarkable. The origins of the great vessels
of the aortic arch appear patent. No pulmonary artery embolus
identified.

Mediastinum/Nodes: No hilar or mediastinal adenopathy. The esophagus
is grossly unremarkable. No mediastinal fluid collection.

Lungs/Pleura: Bibasilar atelectasis. Atypical infiltrate is less
likely. No consolidative change. There is no pleural effusion or
pneumothorax. The central airways are patent.

Upper Abdomen: No acute abnormality.

Musculoskeletal: No chest wall abnormality. No acute or significant
osseous findings.

Review of the MIP images confirms the above findings.
IMPRESSION: No acute intrathoracic pathology. No CT evidence of pulmonary
embolism.

## 2024-02-11 ENCOUNTER — Telehealth: Payer: Self-pay

## 2024-02-11 ENCOUNTER — Other Ambulatory Visit: Payer: Self-pay | Admitting: Family Medicine

## 2024-02-11 DIAGNOSIS — E039 Hypothyroidism, unspecified: Secondary | ICD-10-CM

## 2024-02-11 NOTE — Telephone Encounter (Signed)
Please verify dosage.

## 2024-02-11 NOTE — Telephone Encounter (Signed)
 Not yet signed   Copied from CRM #[id number]. Topic: Clinical - Lab/Test Results >> Feb 11, 2024  2:36 PM Rosaria BRAVO wrote: Reason for CRM: Seeking results, please advise

## 2024-02-11 NOTE — Telephone Encounter (Signed)
 Copied from CRM #[id number]. Topic: Clinical - Medication Refill >> Feb 11, 2024  2:37 PM Rosaria E wrote: Medication:  levothyroxine  (SYNTHROID ) 150 MCG tablet  Pt is unsure if provider wants to increase the dosage   Has the patient contacted their pharmacy? Yes (Agent: If no, request that the patient contact the pharmacy for the refill. If patient does not wish to contact the pharmacy document the reason why and proceed with request.) (Agent: If yes, when and what did the pharmacy advise?)  This is the patient's preferred pharmacy:   [id number] - [address] [place] [address] Phone: [phone] Fax: [phone]   Is this the correct pharmacy for this prescription? Yes If no, delete pharmacy and type the correct one.   Has the prescription been filled recently? Yes  Is the patient out of the medication? Yes  Has the patient been seen for an appointment in the last year OR does the patient have an upcoming appointment? Yes  Can we respond through MyChart? Yes  Agent: Please be advised that Rx refills may take up to 3 business days. We ask that you follow-up with your pharmacy.

## 2024-02-13 NOTE — Telephone Encounter (Signed)
 Requested medications are due for refill today.  no  Requested medications are on the active medications list.  yes  Last refill. 01/29/2024 #90 3 rf  Future visit scheduled.   yes  Notes to clinic.  Pt was unsure if provider wanted to change dose. Please advise.    Requested Prescriptions  Pending Prescriptions Disp Refills   levothyroxine  (SYNTHROID ) 150 MCG tablet 90 tablet 3    Sig: Take 1 tablet (150 mcg total) by mouth daily. Follow up labs in 30 days.     Endocrinology:  Hypothyroid Agents Failed - 02/13/2024  1:11 PM      Failed - TSH in normal range and within 360 days    TSH  Date Value Ref Range Status  01/29/2024 6.870 (H) 0.450 - 4.500 uIU/mL Final         Passed - Valid encounter within last 12 months    Recent Outpatient Visits           2 weeks ago Mixed hyperlipidemia   [facility] [place], Rockie, MD

## 2024-02-19 DIAGNOSIS — E66812 Obesity, class 2: Secondary | ICD-10-CM

## 2024-02-20 ENCOUNTER — Ambulatory Visit: Payer: Self-pay | Admitting: Family Medicine

## 2024-02-20 DIAGNOSIS — E039 Hypothyroidism, unspecified: Secondary | ICD-10-CM

## 2024-02-20 MED ORDER — LEVOTHYROXINE SODIUM 175 MCG PO TABS: 175.0000 ug | ORAL_TABLET | Freq: Every day | ORAL | 3 refills | Status: DC

## 2024-02-23 MED ORDER — WEGOVY 0.25 MG/0.5ML ~~LOC~~ SOAJ: 0.2500 mg | SUBCUTANEOUS | 2 refills | Status: DC

## 2024-02-24 ENCOUNTER — Encounter: Payer: Self-pay | Admitting: Radiology

## 2024-03-03 ENCOUNTER — Ambulatory Visit: Payer: PRIVATE HEALTH INSURANCE | Admitting: Family Medicine

## 2024-03-03 ENCOUNTER — Ambulatory Visit
Admission: RE | Admit: 2024-03-03 | Discharge: 2024-03-03 | Disposition: A | Discharge status: Home / Self Care | Source: Ambulatory Visit | Attending: Family Medicine | Admitting: Family Medicine

## 2024-03-03 VITALS — BP 131/78 | HR 90 | Temp 97.9°F | Resp 18

## 2024-03-03 DIAGNOSIS — R42 Dizziness and giddiness: Secondary | ICD-10-CM | POA: Diagnosis not present

## 2024-03-03 NOTE — ED Triage Notes (Signed)
 Patient to Urgent Care with complaints of feeling out of sorts after starting wegovy on Sunday. Also increased her thyroid  medication on Sunday.   Reports she had an episode yesterday where she felt hot and fatigued.

## 2024-03-03 NOTE — Discharge Instructions (Addendum)
 Follow up with your primary care provider tomorrow.  Go to the emergency department if you have worsening symptoms.    Your blood pressure today is 131/78.

## 2024-03-03 NOTE — ED Provider Notes (Signed)
 [name]    CSN: [id number] Arrival date & time: 03/03/24  0847      History   Chief Complaint Chief Complaint  Patient presents with   Hypertension    Blood pressure check - Entered by patient    HPI Laura Walls is a 63 y.o. female.  Patient presents with concern for blood pressure check.  She felt fatigued, lightheaded, and hot yesterday; this lasted approximately 1.5 hours and resolved after she took a nap.  No sensation of room spinning.  No vision change, focal weakness, numbness, chest pain, shortness of breath, nausea, vomiting.  Today she still feels out of sorts but better than yesterday.  She started her first dose of Wegovy 2 days ago and also notes that her thyroid  medication was increased the same day.  Patient's medical history includes hypertension, hyperlipidemia, obesity, thyroid  disease.  The history is provided by the patient and medical records.    Past Medical History:  Diagnosis Date   Allergy 1980   Arthritis 2020   Carpal tunnel syndrome    both wrist   Cataract 2015   Hormone disorder    Hypertension    Sleep apnea 1995   Thyroid  disease     Patient Active Problem List   Diagnosis Date Noted   Osteoarthritis of both knees 04/30/2023   Pain of both shoulder joints 02/27/2023   Lymphedema 11/24/2021   Vitamin D  deficiency 01/13/2020   Hyperlipidemia 01/22/2019   Hypothyroid 01/22/2019   Essential hypertension 01/22/2019   Class 2 severe obesity due to excess calories with serious comorbidity and body mass index (BMI) of 36.0 to 36.9 in adult 01/22/2019   Family history of colon cancer in mother 01/22/2019    Past Surgical History:  Procedure Laterality Date   ABDOMINAL HYSTERECTOMY     APPENDECTOMY     CARPAL TUNNEL RELEASE     CESAREAN SECTION     HEEL SPUR EXCISION      OB History     Gravida  5   Para  1   Term  1   Preterm      AB  4   Living  1      SAB  4   IAB      Ectopic      Multiple       Live Births  1            Home Medications    Prior to Admission medications   Medication Sig Start Date End Date Taking? Authorizing Provider  atorvastatin  (LIPITOR) 10 MG tablet Take 1 tablet (10 mg total) by mouth daily. 02/04/23   Claudene Tanda POUR, PA-C  hydrochlorothiazide  (HYDRODIURIL ) 25 MG tablet TAKE 1 TABLET (25 MG TOTAL) BY MOUTH DAILY. 05/06/23   Claudene Tanda POUR, PA-C  levothyroxine  (SYNTHROID ) 150 MCG tablet Take 1 tablet (150 mcg total) by mouth daily. Follow up labs in 30 days. 01/29/24   Simmons-Robinson, Rockie, MD  levothyroxine  (SYNTHROID ) 175 MCG tablet Take 1 tablet (175 mcg total) by mouth daily. 02/20/24   Simmons-Robinson, Rockie, MD  semaglutide-weight management (WEGOVY) 0.25 MG/0.5ML SOAJ SQ injection Inject 0.25 mg into the skin once a week. 02/23/24   Simmons-Robinson, Rockie, MD  semaglutide-weight management (WEGOVY) 0.5 MG/0.5ML SOAJ SQ injection INJECT 0.5 MG INTO THE SKIN ONCE A WEEK. INJECT 0.5MG  WEEKLY INTO SKIN 01/31/24   Simmons-Robinson, Rockie, MD    Family History Family History  Problem Relation Age of Onset   Colon cancer  Mother    Cancer Mother    Miscarriages / Stillbirths Mother    Leukemia Father    Asthma Father    Obesity Father    Arthritis Sister    Diabetes Sister    Diabetes Sister    Breast cancer Neg Hx    Bladder Cancer Neg Hx    Kidney cancer Neg Hx     Social History Social History   Tobacco Use   Smoking status: Never   Smokeless tobacco: Never  Vaping Use   Vaping status: Never Used  Substance Use Topics   Alcohol use: Not Currently    Comment: socially, less than monthly   Drug use: Never     Allergies   Cat dander and Molds & smuts   Review of Systems Review of Systems  Constitutional:  Positive for fatigue. Negative for chills and fever.  Eyes:  Negative for visual disturbance.  Respiratory:  Negative for cough and shortness of breath.   Cardiovascular:  Negative for chest pain and  palpitations.  Gastrointestinal:  Negative for nausea and vomiting.  Neurological:  Positive for light-headedness. Negative for dizziness, syncope, facial asymmetry, speech difficulty, weakness, numbness and headaches.     Physical Exam Triage Vital Signs ED Triage Vitals  Encounter Vitals Group     BP 03/03/24 0910 131/78     Girls Systolic BP Percentile --      Girls Diastolic BP Percentile --      Boys Systolic BP Percentile --      Boys Diastolic BP Percentile --      Pulse Rate 03/03/24 0910 90     Resp 03/03/24 0910 18     Temp 03/03/24 0910 97.9 F (36.6 C)     Temp src --      SpO2 03/03/24 0910 98 %     Weight --      Height --      Head Circumference --      Peak Flow --      Pain Score 03/03/24 0911 0     Pain Loc --      Pain Education --      Exclude from Growth Chart --    No data found.  Updated Vital Signs BP 131/78   Pulse 90   Temp 97.9 F (36.6 C)   Resp 18   SpO2 98%   Visual Acuity Right Eye Distance:   Left Eye Distance:   Bilateral Distance:    Right Eye Near:   Left Eye Near:    Bilateral Near:     Physical Exam Constitutional:      General: She is not in acute distress. HENT:     Right Ear: Tympanic membrane normal.     Left Ear: Tympanic membrane normal.     Nose: Nose normal.     Mouth/Throat:     Mouth: Mucous membranes are moist.     Pharynx: Oropharynx is clear.  Eyes:     Extraocular Movements: Extraocular movements intact.     Pupils: Pupils are equal, round, and reactive to light.  Cardiovascular:     Rate and Rhythm: Normal rate and regular rhythm.     Heart sounds: Normal heart sounds.  Pulmonary:     Effort: Pulmonary effort is normal. No respiratory distress.     Breath sounds: Normal breath sounds.  Neurological:     General: No focal deficit present.     Mental Status: She is alert and oriented to  person, place, and time.     Cranial Nerves: No cranial nerve deficit.     Sensory: No sensory deficit.      Motor: No weakness.     Gait: Gait normal.      UC Treatments / Results  Labs (all labs ordered are listed, but only abnormal results are displayed) Labs Reviewed - No data to display  EKG   Radiology No results found.  Procedures Procedures (including critical care time)  Medications Ordered in UC Medications - No data to display  Initial Impression / Assessment and Plan / UC Course  I have reviewed the triage vital signs and the nursing notes.  Pertinent labs & imaging results that were available during my care of the patient were reviewed by me and considered in my medical decision making (see chart for details).    Lightheadedness.  Afebrile and vital signs are stable.  Exam is reassuring.  Patient had an episode of lightheadedness and fatigue yesterday which resolved after taking a nap.  She is concerned for possible blood pressure issue.  Her blood pressure today is 131/78.  She started [facility] and also increased her thyroid  medication dosage 2 days ago.  She does not have any focal weakness, chest pain, shortness of breath.  Instructed patient to follow-up with her PCP tomorrow.  ED precautions given.  She agrees to plan of care.  Final Clinical Impressions(s) / UC Diagnoses   Final diagnoses:  Lightheadedness     Discharge Instructions      Follow up with your primary care provider tomorrow.  Go to the emergency department if you have worsening symptoms.    Your blood pressure today is 131/78.     ED Prescriptions   None    PDMP not reviewed this encounter.   Corlis Burnard DEL, NP 03/03/24 [phone]

## 2024-05-01 ENCOUNTER — Ambulatory Visit: Payer: PRIVATE HEALTH INSURANCE | Admitting: Family Medicine

## 2024-05-01 ENCOUNTER — Encounter: Payer: Self-pay | Admitting: Family Medicine

## 2024-05-01 VITALS — BP 127/67 | HR 73 | Ht 68.0 in | Wt 241.2 lb

## 2024-05-01 DIAGNOSIS — E559 Vitamin D deficiency, unspecified: Secondary | ICD-10-CM | POA: Diagnosis not present

## 2024-05-01 DIAGNOSIS — E039 Hypothyroidism, unspecified: Secondary | ICD-10-CM

## 2024-05-01 DIAGNOSIS — I10 Essential (primary) hypertension: Secondary | ICD-10-CM | POA: Diagnosis not present

## 2024-05-01 DIAGNOSIS — E66812 Obesity, class 2: Secondary | ICD-10-CM | POA: Diagnosis not present

## 2024-05-01 DIAGNOSIS — E7849 Other hyperlipidemia: Secondary | ICD-10-CM

## 2024-05-01 DIAGNOSIS — Z6836 Body mass index (BMI) 36.0-36.9, adult: Secondary | ICD-10-CM | POA: Diagnosis not present

## 2024-05-01 DIAGNOSIS — E782 Mixed hyperlipidemia: Secondary | ICD-10-CM

## 2024-05-01 MED ORDER — BUPROPION HCL ER (XL) 150 MG PO TB24: 150.0000 mg | ORAL_TABLET | Freq: Every day | ORAL | 1 refills | Status: AC

## 2024-05-01 MED ORDER — ATORVASTATIN CALCIUM 10 MG PO TABS: 10.0000 mg | ORAL_TABLET | Freq: Every day | ORAL | 3 refills | Status: AC

## 2024-05-01 MED ORDER — NALTREXONE HCL 50 MG PO TABS: 25.0000 mg | ORAL_TABLET | Freq: Every day | ORAL | 1 refills | Status: AC

## 2024-05-01 NOTE — Patient Instructions (Signed)
 To keep you healthy, please keep in mind the following health maintenance items that you are due for:   Health Maintenance Due  Topic Date Due   Medicare Annual Wellness (AWV)  Never done   Zoster Vaccines- Shingrix (1 of 2) Never done   Bone Density Scan  Never done   COVID-19 Vaccine (6 - 2025-26 season) 12/23/2023     Best Wishes,   Dr. Lang

## 2024-05-01 NOTE — Progress Notes (Unsigned)
 "  Established Patient Office Visit  Patient ID: Laura Walls, female    DOB: 11-Sep-1960  Age: 64 y.o. MRN: [id number] PCP: Sharma Coyer, MD  Chief Complaint  Patient presents with   Medical Management of Chronic Issues    Patient is present for follow with PCP, doing well with synthroid  dose change. Stopped wegovy  due to side effects     Subjective:     HPI  Discussed the use of AI scribe software for clinical note transcription with the patient, who gave verbal consent to proceed.  History of Present Illness Laura Walls is a 64 year old female who presents for a two month follow-up.  She discontinued Wegovy  due to side effects including constipation, feeling 'weird', heart racing, and lightheadedness. She felt much better after stopping the medication. Her blood pressure was elevated during the period when she was taking Wegovy .  She has class two obesity with a BMI of 36 and has experienced a weight loss of six pounds since her last visit, which she attributes to water weight loss after stopping Wegovy  and possibly due to increased urination. She reports overeating but not binge eating.  She has chronic hypertension, currently well-controlled with hydrochlorothiazide  25 mg daily.  She has a history of hypothyroidism. Her Synthroid  dose was increased to 175 mcg three months ago due to an elevated TSH of 6.87. She feels well on the current dose.  She requires refills for atorvastatin  10 mg daily for hyperlipidemia.  She traveled to [place] for Christmas with her daughter and sisters, enjoying family time and visiting [place].   Patient Active Problem List   Diagnosis Date Noted   Osteoarthritis of both knees 04/30/2023   Pain of both shoulder joints 02/27/2023   Lymphedema 11/24/2021   Vitamin D  deficiency 01/13/2020   Hyperlipidemia 01/22/2019   Hypothyroid 01/22/2019   Essential hypertension 01/22/2019   Class 2 severe obesity due to excess  calories with serious comorbidity and body mass index (BMI) of 36.0 to 36.9 in adult 01/22/2019   Family history of colon cancer in mother 01/22/2019     Outpatient Encounter Medications as of 05/01/2024  Medication Sig   buPROPion  (WELLBUTRIN  XL) 150 MG 24 hr tablet Take 1 tablet (150 mg total) by mouth daily.   hydrochlorothiazide  (HYDRODIURIL ) 25 MG tablet TAKE 1 TABLET (25 MG TOTAL) BY MOUTH DAILY.   levothyroxine  (SYNTHROID ) 175 MCG tablet Take 1 tablet (175 mcg total) by mouth daily.   naltrexone  (DEPADE) 50 MG tablet Take 0.5 tablets (25 mg total) by mouth daily.   [DISCONTINUED] atorvastatin  (LIPITOR) 10 MG tablet Take 1 tablet (10 mg total) by mouth daily.   atorvastatin  (LIPITOR) 10 MG tablet Take 1 tablet (10 mg total) by mouth daily.   [DISCONTINUED] levothyroxine  (SYNTHROID ) 150 MCG tablet Take 1 tablet (150 mcg total) by mouth daily. Follow up labs in 30 days.   [DISCONTINUED] semaglutide -weight management (WEGOVY ) 0.25 MG/0.5ML SOAJ SQ injection Inject 0.25 mg into the skin once a week.   [DISCONTINUED] semaglutide -weight management (WEGOVY ) 0.5 MG/0.5ML SOAJ SQ injection INJECT 0.5 MG INTO THE SKIN ONCE A WEEK. INJECT 0.5MG  WEEKLY INTO SKIN   No facility-administered encounter medications on file as of 05/01/2024.    ROS    Objective:     BP 127/67 (BP Location: Left Arm, Patient Position: Sitting, Cuff Size: Normal)   Pulse 73   Ht 5' 8 (1.727 m)   Wt 241 lb 3.2 oz (109.4 kg)   SpO2 100%   BMI  36.67 kg/m   BP Readings from Last 3 Encounters:  05/01/24 127/67  03/03/24 131/78  01/29/24 134/68   Wt Readings from Last 3 Encounters:  05/01/24 241 lb 3.2 oz (109.4 kg)  01/29/24 247 lb (112 kg)  09/04/23 243 lb (110.2 kg)    Physical Exam Vitals reviewed.  Constitutional:      General: She is not in acute distress.    Appearance: Normal appearance. She is not ill-appearing.  Cardiovascular:     Rate and Rhythm: Normal rate and regular rhythm.  Pulmonary:      Effort: Pulmonary effort is normal. No respiratory distress.     Breath sounds: No wheezing, rhonchi or rales.  Neurological:     Mental Status: She is alert and oriented to person, place, and time.  Psychiatric:        Mood and Affect: Mood normal.        Behavior: Behavior normal.      No results found for any visits on 05/01/24.  Last metabolic panel Lab Results  Component Value Date   GLUCOSE 107 (H) 01/29/2024   NA 144 01/29/2024   K 4.4 01/29/2024   CL 104 01/29/2024   CO2 26 01/29/2024   BUN 10 01/29/2024   CREATININE 0.89 01/29/2024   EGFR 72 01/29/2024   CALCIUM  9.4 01/29/2024   PHOS 3.6 09/14/2022   PROT 6.5 01/29/2024   ALBUMIN 4.0 01/29/2024   LABGLOB 2.5 01/29/2024   AGRATIO 1.3 09/14/2022   BILITOT 0.4 01/29/2024   ALKPHOS 96 01/29/2024   AST 14 01/29/2024   ALT 14 01/29/2024   ANIONGAP 8 08/26/2021   Last lipids Lab Results  Component Value Date   CHOL 216 (H) 01/29/2024   HDL 40 01/29/2024   LDLCALC 143 (H) 01/29/2024   TRIG 183 (H) 01/29/2024   CHOLHDL 5.4 (H) 01/29/2024  The 10-year ASCVD risk score (Arnett DK, et al., 2019) is: 10.3%  Last hemoglobin A1c Lab Results  Component Value Date   HGBA1C 5.4 01/29/2024   Last thyroid  functions Lab Results  Component Value Date   TSH 6.870 (H) 01/29/2024   T4TOTAL 9.6 01/31/2023   FREET4 1.12 01/29/2024   Last vitamin D  Lab Results  Component Value Date   VD25OH 61.6 03/23/2020   Last vitamin B12 and Folate Lab Results  Component Value Date   VITAMINB12 373 01/01/2020      The 10-year ASCVD risk score (Arnett DK, et al., 2019) is: 10.3%    Assessment & Plan:   Problem List Items Addressed This Visit     Class 2 severe obesity due to excess calories with serious comorbidity and body mass index (BMI) of 36.0 to 36.9 in adult - Primary   Class 2 obesity Chronic  BMI of 36. Discontinued Wegovy  due to side effects including constipation, lightheadedness, and heart palpitations.  Reports overeating but denies binge eating. Interested in pharmacological options to aid weight loss. - Prescribed Wellbutrin  150 mg daily and naltrexone  25 mg daily to aid in weight loss by reducing cravings. - Encouraged continuation of calorie deficit and physical activity. - Discussed intermittent fasting as a potential strategy for weight loss.      Relevant Medications   buPROPion  (WELLBUTRIN  XL) 150 MG 24 hr tablet   naltrexone  (DEPADE) 50 MG tablet   Essential hypertension   Essential hypertension Chronic  Blood pressure today is 127/67 mmHg. - Continue current antihypertensive regimen including hydrochlorothiazide  25 mg daily.      Relevant Medications  atorvastatin  (LIPITOR) 10 MG tablet   Hyperlipidemia   Mixed hyperlipidemia Chronic  Managed with atorvastatin . - Refilled atorvastatin  10 mg daily.      Relevant Medications   atorvastatin  (LIPITOR) 10 MG tablet   Hypothyroid   Acquired hypothyroidism Chronic  Previous elevated TSH of 6.87. Synthroid  dose increased to 175 mcg three months ago. Reports feeling well on current dose. - Ordered TSH and T4 levels to assess response to current Synthroid  dose.      Relevant Orders   TSH + free T4   Vitamin D  deficiency    Vitamin D  deficiency, chronic Chronic vitamin D  deficiency. - recommend OTC vitamin D  supplementation        Assessment and Plan Assessment & Plan           Return in about 3 months (around 07/30/2024) for Weight MGMT(nalt+wellb).    Rockie Agent, MD [facility]   "

## 2024-05-02 LAB — TSH+FREE T4
Free T4: 1.7 ng/dL (ref 0.82–1.77)
TSH: 1.9 u[IU]/mL (ref 0.450–4.500)

## 2024-05-02 NOTE — Assessment & Plan Note (Signed)
 Mixed hyperlipidemia Chronic  Managed with atorvastatin . - Refilled atorvastatin  10 mg daily.

## 2024-05-02 NOTE — Assessment & Plan Note (Signed)
  Vitamin D  deficiency, chronic Chronic vitamin D  deficiency. - recommend OTC vitamin D  supplementation

## 2024-05-02 NOTE — Assessment & Plan Note (Signed)
 Class 2 obesity Chronic  BMI of 36. Discontinued Wegovy  due to side effects including constipation, lightheadedness, and heart palpitations. Reports overeating but denies binge eating. Interested in pharmacological options to aid weight loss. - Prescribed Wellbutrin  150 mg daily and naltrexone  25 mg daily to aid in weight loss by reducing cravings. - Encouraged continuation of calorie deficit and physical activity. - Discussed intermittent fasting as a potential strategy for weight loss.

## 2024-05-02 NOTE — Assessment & Plan Note (Signed)
 Acquired hypothyroidism Chronic  Previous elevated TSH of 6.87. Synthroid  dose increased to 175 mcg three months ago. Reports feeling well on current dose. - Ordered TSH and T4 levels to assess response to current Synthroid  dose.

## 2024-05-02 NOTE — Assessment & Plan Note (Signed)
 Essential hypertension Chronic  Blood pressure today is 127/67 mmHg. - Continue current antihypertensive regimen including hydrochlorothiazide  25 mg daily.

## 2024-05-03 ENCOUNTER — Ambulatory Visit: Payer: Self-pay | Admitting: Family Medicine

## 2024-05-20 ENCOUNTER — Other Ambulatory Visit: Payer: Self-pay

## 2024-05-20 NOTE — Progress Notes (Signed)
 Completed and cleared pre employment UDS for [employer] after consents signed.

## 2024-05-24 ENCOUNTER — Other Ambulatory Visit: Payer: Self-pay | Admitting: Family Medicine

## 2024-05-24 DIAGNOSIS — E039 Hypothyroidism, unspecified: Secondary | ICD-10-CM

## 2024-07-01 ENCOUNTER — Ambulatory Visit

## 2024-08-05 ENCOUNTER — Ambulatory Visit

## 2024-08-10 ENCOUNTER — Ambulatory Visit: Admitting: Family Medicine
